# Patient Record
Sex: Male | Born: 1950 | Race: White | Hispanic: No | Smoking: Never smoker
Health system: Southern US, Community
[De-identification: ages and names within clinical notes are randomized; demographics above are authoritative.]

## PROBLEM LIST (undated history)

## (undated) DIAGNOSIS — M549 Dorsalgia, unspecified: Secondary | ICD-10-CM

## (undated) DIAGNOSIS — I10 Essential (primary) hypertension: Secondary | ICD-10-CM

## (undated) DIAGNOSIS — N289 Disorder of kidney and ureter, unspecified: Secondary | ICD-10-CM

## (undated) DIAGNOSIS — C801 Malignant (primary) neoplasm, unspecified: Secondary | ICD-10-CM

## (undated) DIAGNOSIS — M109 Gout, unspecified: Secondary | ICD-10-CM

## (undated) DIAGNOSIS — G8929 Other chronic pain: Secondary | ICD-10-CM

## (undated) DIAGNOSIS — M542 Cervicalgia: Secondary | ICD-10-CM

## (undated) HISTORY — PX: OTHER SURGICAL HISTORY: SHX169

---

## 2000-06-10 ENCOUNTER — Encounter: Payer: Self-pay | Admitting: Emergency Medicine

## 2000-06-10 ENCOUNTER — Emergency Department (HOSPITAL_COMMUNITY): Admission: EM | Admit: 2000-06-10 | Discharge: 2000-06-10 | Payer: Self-pay | Admitting: Emergency Medicine

## 2000-08-08 ENCOUNTER — Inpatient Hospital Stay (HOSPITAL_COMMUNITY): Admission: EM | Admit: 2000-08-08 | Discharge: 2000-08-09 | Payer: Self-pay | Admitting: Emergency Medicine

## 2000-08-08 ENCOUNTER — Encounter: Payer: Self-pay | Admitting: Emergency Medicine

## 2003-08-30 ENCOUNTER — Emergency Department (HOSPITAL_COMMUNITY): Admission: EM | Admit: 2003-08-30 | Discharge: 2003-08-30 | Payer: Self-pay | Admitting: Emergency Medicine

## 2010-06-23 ENCOUNTER — Emergency Department (HOSPITAL_COMMUNITY): Admission: EM | Admit: 2010-06-23 | Discharge: 2010-06-23 | Payer: Self-pay | Admitting: Emergency Medicine

## 2014-04-09 ENCOUNTER — Encounter (HOSPITAL_COMMUNITY): Payer: Self-pay | Admitting: Emergency Medicine

## 2014-04-09 ENCOUNTER — Emergency Department (HOSPITAL_COMMUNITY)
Admission: EM | Admit: 2014-04-09 | Discharge: 2014-04-09 | Disposition: A | Discharge status: Home / Self Care | Payer: No Typology Code available for payment source | Attending: Emergency Medicine | Admitting: Emergency Medicine

## 2014-04-09 ENCOUNTER — Emergency Department (HOSPITAL_COMMUNITY): Payer: No Typology Code available for payment source

## 2014-04-09 DIAGNOSIS — S99929A Unspecified injury of unspecified foot, initial encounter: Secondary | ICD-10-CM

## 2014-04-09 DIAGNOSIS — S199XXA Unspecified injury of neck, initial encounter: Secondary | ICD-10-CM

## 2014-04-09 DIAGNOSIS — I10 Essential (primary) hypertension: Secondary | ICD-10-CM | POA: Insufficient documentation

## 2014-04-09 DIAGNOSIS — S161XXA Strain of muscle, fascia and tendon at neck level, initial encounter: Secondary | ICD-10-CM

## 2014-04-09 DIAGNOSIS — Y9389 Activity, other specified: Secondary | ICD-10-CM | POA: Insufficient documentation

## 2014-04-09 DIAGNOSIS — S99919A Unspecified injury of unspecified ankle, initial encounter: Secondary | ICD-10-CM

## 2014-04-09 DIAGNOSIS — S139XXA Sprain of joints and ligaments of unspecified parts of neck, initial encounter: Secondary | ICD-10-CM | POA: Insufficient documentation

## 2014-04-09 DIAGNOSIS — S0993XA Unspecified injury of face, initial encounter: Secondary | ICD-10-CM | POA: Insufficient documentation

## 2014-04-09 DIAGNOSIS — S8990XA Unspecified injury of unspecified lower leg, initial encounter: Secondary | ICD-10-CM | POA: Insufficient documentation

## 2014-04-09 DIAGNOSIS — Y92410 Unspecified street and highway as the place of occurrence of the external cause: Secondary | ICD-10-CM | POA: Insufficient documentation

## 2014-04-09 DIAGNOSIS — M5030 Other cervical disc degeneration, unspecified cervical region: Secondary | ICD-10-CM | POA: Insufficient documentation

## 2014-04-09 HISTORY — DX: Essential (primary) hypertension: I10

## 2014-04-09 MED ORDER — CYCLOBENZAPRINE HCL 5 MG PO TABS: 5.0000 mg | ORAL_TABLET | Freq: Three times a day (TID) | ORAL | Status: DC | PRN

## 2014-04-09 MED ORDER — IBUPROFEN 200 MG PO TABS
600.0000 mg | ORAL_TABLET | Freq: Once | ORAL | Status: AC
  Administered 2014-04-09: 600 mg via ORAL
  Filled 2014-04-09: qty 3

## 2014-04-09 MED ORDER — IBUPROFEN 600 MG PO TABS: 600.0000 mg | ORAL_TABLET | Freq: Three times a day (TID) | ORAL | Status: AC | PRN
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
## 2014-04-09 NOTE — Discharge Instructions (Signed)
Your xray do not show any fractures
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
## 2014-04-09 NOTE — ED Notes (Signed)
Patient transported to X-ray 

## 2014-04-11 NOTE — ED Provider Notes (Signed)
Medical screening examination/treatment/procedure(s) were performed by non-physician practitioner and as supervising physician I was immediately available for consultation/collaboration.   EKG Interpretation None        Tanna Furry, MD 04/11/14 [phone]

## 2016-06-13 ENCOUNTER — Emergency Department (HOSPITAL_COMMUNITY): Payer: Self-pay

## 2016-06-13 ENCOUNTER — Emergency Department (HOSPITAL_COMMUNITY)
Admission: EM | Admit: 2016-06-13 | Discharge: 2016-06-13 | Disposition: A | Discharge status: Home / Self Care | Payer: Self-pay | Attending: Emergency Medicine | Admitting: Emergency Medicine

## 2016-06-13 ENCOUNTER — Encounter (HOSPITAL_COMMUNITY): Payer: Self-pay | Admitting: Emergency Medicine

## 2016-06-13 DIAGNOSIS — Z79899 Other long term (current) drug therapy: Secondary | ICD-10-CM | POA: Insufficient documentation

## 2016-06-13 DIAGNOSIS — N200 Calculus of kidney: Secondary | ICD-10-CM | POA: Insufficient documentation

## 2016-06-13 DIAGNOSIS — I10 Essential (primary) hypertension: Secondary | ICD-10-CM | POA: Insufficient documentation

## 2016-06-13 LAB — COMPREHENSIVE METABOLIC PANEL
ALBUMIN: 4 g/dL (ref 3.5–5.0)
ALK PHOS: 48 U/L (ref 38–126)
ALT: 10 U/L — ABNORMAL LOW (ref 17–63)
ANION GAP: 6 (ref 5–15)
AST: 15 U/L (ref 15–41)
BILIRUBIN TOTAL: 0.8 mg/dL (ref 0.3–1.2)
BUN: 14 mg/dL (ref 6–20)
CALCIUM: 8.9 mg/dL (ref 8.9–10.3)
CO2: 27 mmol/L (ref 22–32)
Chloride: 105 mmol/L (ref 101–111)
Creatinine, Ser: 1.36 mg/dL — ABNORMAL HIGH (ref 0.61–1.24)
GFR calc non Af Amer: 53 mL/min — ABNORMAL LOW (ref >60)
GLUCOSE: 195 mg/dL — AB (ref 65–99)
POTASSIUM: 3.6 mmol/L (ref 3.5–5.1)
SODIUM: 138 mmol/L (ref 135–145)
TOTAL PROTEIN: 7.2 g/dL (ref 6.5–8.1)

## 2016-06-13 LAB — CBC WITH DIFFERENTIAL/PLATELET
BASOS PCT: 0 %
Basophils Absolute: 0 10*3/uL (ref 0.0–0.1)
EOS ABS: 0 10*3/uL (ref 0.0–0.7)
Eosinophils Relative: 0 %
HEMATOCRIT: 39 % (ref 39.0–52.0)
Hemoglobin: 12.9 g/dL — ABNORMAL LOW (ref 13.0–17.0)
LYMPHS ABS: 1.2 10*3/uL (ref 0.7–4.0)
Lymphocytes Relative: 13 %
MCH: 28.9 pg (ref 26.0–34.0)
MCHC: 33.1 g/dL (ref 30.0–36.0)
MCV: 87.2 fL (ref 78.0–100.0)
MONO ABS: 0.3 10*3/uL (ref 0.1–1.0)
MONOS PCT: 4 %
Neutro Abs: 7.8 10*3/uL — ABNORMAL HIGH (ref 1.7–7.7)
Neutrophils Relative %: 83 %
Platelets: 174 10*3/uL (ref 150–400)
RBC: 4.47 MIL/uL (ref 4.22–5.81)
RDW: 12.9 % (ref 11.5–15.5)
WBC: 9.3 10*3/uL (ref 4.0–10.5)

## 2016-06-13 LAB — URINALYSIS, ROUTINE W REFLEX MICROSCOPIC
Bilirubin Urine: NEGATIVE
GLUCOSE, UA: NEGATIVE mg/dL
KETONES UR: NEGATIVE mg/dL
LEUKOCYTES UA: NEGATIVE
NITRITE: NEGATIVE
PROTEIN: NEGATIVE mg/dL
Specific Gravity, Urine: 1.013 (ref 1.005–1.030)
pH: 5.5 (ref 5.0–8.0)

## 2016-06-13 LAB — URINE MICROSCOPIC-ADD ON: WBC UA: NONE SEEN WBC/hpf (ref 0–5)

## 2016-06-13 MED ORDER — HYDROMORPHONE HCL 1 MG/ML IJ SOLN
1.0000 mg | Freq: Once | INTRAMUSCULAR | Status: AC
  Administered 2016-06-13: 1 mg via INTRAVENOUS
  Filled 2016-06-13: qty 1

## 2016-06-13 MED ORDER — KETOROLAC TROMETHAMINE 30 MG/ML IJ SOLN
30.0000 mg | Freq: Once | INTRAMUSCULAR | Status: AC
  Administered 2016-06-13: 30 mg via INTRAVENOUS
  Filled 2016-06-13: qty 1

## 2016-06-13 MED ORDER — ONDANSETRON HCL 4 MG/2ML IJ SOLN
4.0000 mg | Freq: Once | INTRAMUSCULAR | Status: AC
  Administered 2016-06-13: 4 mg via INTRAVENOUS
  Filled 2016-06-13: qty 2

## 2016-06-13 MED ORDER — ONDANSETRON 4 MG PO TBDP: ORAL_TABLET | ORAL | 0 refills | Status: DC

## 2016-06-13 MED ORDER — HYDROMORPHONE HCL 4 MG PO TABS: 4.0000 mg | ORAL_TABLET | Freq: Four times a day (QID) | ORAL | 0 refills | Status: DC | PRN

## 2016-06-13 NOTE — Discharge Instructions (Signed)
Follow-up with your urologist in a week return if problems

## 2016-06-13 NOTE — ED Notes (Signed)
Pt states he is going to wait for his wife out front. Refuses to stay in the room. Educated not to drive after receiving IV pain medication. 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

## 2016-06-13 NOTE — ED Triage Notes (Signed)
Pt presents to ED with c/o right flank pain with nausea, no vomiting or diarrhea.  Pt reports waking up to severe right flank this morning at 0100.  Has Hx of kidney stones.  Pt took Oxycodone 20 mg 3 hours ago for chronic back/neck pain.

## 2016-06-13 NOTE — ED Provider Notes (Signed)
[facility] DEPT Provider Note   CSN: [id number] Arrival date & time: 06/13/16  [id number]     History   Chief Complaint Chief Complaint  Patient presents with  . Flank Pain    HPI Evan Jimenez is a 65 y.o. male.  Patient complains of right flank pain started this morning very severe. Patient has a history of kidney stones   The history is provided by the patient. No language interpreter was used.  Flank Pain  This is a new problem. The problem occurs constantly. The problem has not changed since onset.Pertinent negatives include no chest pain, no abdominal pain and no headaches. Nothing aggravates the symptoms. Nothing relieves the symptoms. Treatments tried: Patient took some oxycodone without help. The treatment provided mild relief.    Past Medical History:  Diagnosis Date  . Hypertension     There are no active problems to display for this patient.   History reviewed. No pertinent surgical history.     Home Medications    Prior to Admission medications   Medication Sig Start Date End Date Taking? Authorizing Provider  cyclobenzaprine (FLEXERIL) 5 MG tablet Take 1 tablet (5 mg total) by mouth 3 (three) times daily as needed for muscle spasms. 04/09/14  Yes Junius Creamer, NP  enalapril (VASOTEC) 5 MG tablet Take 15 mg by mouth daily. 05/04/16  Yes Historical Provider, MD  Oxycodone HCl 10 MG TABS TAKE 1 TABLET BY MOUTH 5 TIMES daily AS NEEDED FOR PAIN 05/14/16  Yes Historical Provider, MD  traZODone (DESYREL) 50 MG tablet TAKE 1 OR 2 TABLETS BY MOUTH AT BEDTIME AS NEEDED 06/04/16  Yes Historical Provider, MD  cephALEXin (KEFLEX) 500 MG capsule Take 500 mg by mouth 3 (three) times daily. 06/12/16   Historical Provider, MD  HYDROmorphone (DILAUDID) 4 MG tablet Take 1 tablet (4 mg total) by mouth every 6 (six) hours as needed for severe pain. 06/13/16   Milton Ferguson, MD  ibuprofen (ADVIL,MOTRIN) 600 MG tablet Take 1 tablet (600 mg total) by mouth every 8 (eight) hours as  needed. Patient not taking: Reported on 06/13/2016 04/09/14   Junius Creamer, NP  ondansetron (ZOFRAN ODT) 4 MG disintegrating tablet 4mg  ODT q4 hours prn nausea/vomit 06/13/16   Milton Ferguson, MD    Family History History reviewed. No pertinent family history.  Social History Social History  Substance Use Topics  . Smoking status: Never Smoker  . Smokeless tobacco: Never Used  . Alcohol use No     Allergies   Review of patient's allergies indicates no known allergies.   Review of Systems Review of Systems  Constitutional: Negative for appetite change and fatigue.  HENT: Negative for congestion, ear discharge and sinus pressure.   Eyes: Negative for discharge.  Respiratory: Negative for cough.   Cardiovascular: Negative for chest pain.  Gastrointestinal: Negative for abdominal pain and diarrhea.  Genitourinary: Positive for flank pain. Negative for frequency and hematuria.  Musculoskeletal: Negative for back pain.  Skin: Negative for rash.  Neurological: Negative for seizures and headaches.  Psychiatric/Behavioral: Negative for hallucinations.     Physical Exam Updated Vital Signs BP (!) 165/103   Pulse 91   Temp 98.7 F (37.1 C) (Oral)   Resp 20   Ht 6\' 3"  (1.905 m)   Wt 205 lb (93 kg)   SpO2 99%   BMI 25.62 kg/m   Physical Exam  Constitutional: He is oriented to person, place, and time. He appears well-developed.  HENT:  Head: Normocephalic.  Eyes: Conjunctivae  and EOM are normal. No scleral icterus.  Neck: Neck supple. No thyromegaly present.  Cardiovascular: Normal rate and regular rhythm.  Exam reveals no gallop and no friction rub.   No murmur heard. Pulmonary/Chest: No stridor. He has no wheezes. He has no rales. He exhibits no tenderness.  Abdominal: He exhibits no distension. There is no tenderness. There is no rebound.  Genitourinary:  Genitourinary Comments: Patient has tenderness to right flank  Musculoskeletal: Normal range of motion. He exhibits no  edema.  Lymphadenopathy:    He has no cervical adenopathy.  Neurological: He is oriented to person, place, and time. He exhibits normal muscle tone. Coordination normal.  Skin: No rash noted. No erythema.  Psychiatric: He has a normal mood and affect. His behavior is normal.     ED Treatments / Results  Labs (all labs ordered are listed, but only abnormal results are displayed) Labs Reviewed  CBC WITH DIFFERENTIAL/PLATELET - Abnormal; Notable for the following:       Result Value   Hemoglobin 12.9 (*)    Neutro Abs 7.8 (*)    All other components within normal limits  COMPREHENSIVE METABOLIC PANEL - Abnormal; Notable for the following:    Glucose, Bld 195 (*)    Creatinine, Ser 1.36 (*)    ALT 10 (*)    GFR calc non Af Amer 53 (*)    All other components within normal limits  URINALYSIS, ROUTINE W REFLEX MICROSCOPIC (NOT AT [facility]) - Abnormal; Notable for the following:    APPearance CLOUDY (*)    Hgb urine dipstick LARGE (*)    All other components within normal limits  URINE MICROSCOPIC-ADD ON - Abnormal; Notable for the following:    Squamous Epithelial / LPF 0-5 (*)    Bacteria, UA FEW (*)    All other components within normal limits    EKG  EKG Interpretation None       Radiology Ct Renal Stone Study  Result Date: 06/13/2016 CLINICAL DATA:  Right flank pain with nausea EXAM: CT ABDOMEN AND PELVIS WITHOUT CONTRAST TECHNIQUE: Multidetector CT imaging of the abdomen and pelvis was performed following the standard protocol without oral or intravenous contrast material administration. COMPARISON:  August 30, 2003 FINDINGS: Lower chest: Lung bases are clear. There are foci of coronary artery calcification. Hepatobiliary: No focal liver lesions are appreciable on this noncontrast enhanced study. Gallbladder wall is not appreciably thickened. There is no biliary duct dilatation. Pancreas: No pancreatic mass or inflammatory focus. Spleen: No splenic lesions evident. Spleen  measures 14.8 x 13.9 x 8.4 cm with a measured splenic volume of 864 cubic cm. Adrenals/Urinary Tract: Adrenals appear unremarkable bilaterally. There is a 7 mm cyst in the mid right kidney. There is a 1 x 1 cm cyst in the posterior mid left kidney. There is mild to moderate hydronephrosis on the right. There is no hydronephrosis on the left. On the right, there is a 3 mm calculus with a nearby 1 mm calculus in the upper pole region. There is a 3 mm calculus with a nearby 1 mm calculus in the mid right kidney. There is a 2 mm calculus anteriorly in the mid kidney on the right as well. On the left, there is a 3 mm calculus with several 1 mm calculi in the upper to mid regions. There is a 2 mm calculus more posteriorly in the left mid kidney region. There is a 1 mm calcification in the lower pole region on the left. There is  a calculus in the upper left ureter slightly beyond the ureteropelvic junction measuring 4 x 4 mm, appreciable at the inferior L3 level. No other ureteral calculi are evident on either side. Urinary bladder is midline with wall thickness within normal limits. Stomach/Bowel: There are scattered sigmoid diverticula without diverticulitis. There is no bowel wall or mesenteric thickening. There is no evident bowel obstruction. No free air or portal venous air. Vascular/Lymphatic: There is atherosclerotic calcification in the aorta and common iliac arteries. Calcifications is also noted in multiple mesenteric vessels, most notably in the splenic artery. There is moderate calcification at the origin of the left renal artery causing approximately 50% diameter stenosis of the origin of this vessel. There is no abdominal aortic aneurysm. There is no adenopathy in the abdomen or pelvis. Reproductive: Prostate and seminal vesicles appear unremarkable. No pelvic mass or pelvic fluid collection. Other: Appendix is not appreciable. There is no periappendiceal region inflammation. There is no evidence of  adenopathy or abscess in the abdomen or pelvis. There is fat in each inguinal ring. There is again noted a midline ventral hernia. There is a stable focus of fluid within the ventral hernia but no bowel. A linear structure extends from this fluid to the bladder, suggesting that the fluid within the ventral hernia represents a urachal cyst/urachal remnant. Musculoskeletal: There are no blastic or lytic bone lesions. There is no intramuscular lesion. IMPRESSION: 4 x 4 mm calculus in the right ureter at the L3-4 level causing mild to moderate hydronephrosis and proximal ureterectasis on the right. There are intrarenal calculi bilaterally, more on the right than on the left. Splenomegaly of uncertain etiology. No focal splenic lesions evident on this noncontrast enhanced study. Scattered sigmoid diverticula without diverticulitis. No bowel obstruction. No abscess. 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 This finding is stable compared to 2004. No bowel extends into this ventral hernia. There is fat in each inguinal ring. There is aortoiliac atherosclerosis. Calcification is noted in several mesenteric vessels. No high-grade obstruction evident on noncontrast enhanced study. Foci of coronary artery calcification noted. Electronically Signed   By: Lowella Grip III M.D.   On: 06/13/2016 07:56    Procedures Procedures (including critical care time)  Medications Ordered in ED Medications  ketorolac (TORADOL) 30 MG/ML injection 30 mg (30 mg Intravenous Given 06/13/16 0748)  ondansetron (ZOFRAN) injection 4 mg (4 mg Intravenous Given 06/13/16 0748)  HYDROmorphone (DILAUDID) injection 1 mg (1 mg Intravenous Given 06/13/16 0748)     Initial Impression / Assessment and Plan / ED Course  I have reviewed the triage vital signs and the nursing notes.  Pertinent labs & imaging results that were available during my care of the patient were reviewed by me and  considered in my medical decision making (see chart for details).  Clinical Course    Patient has a 4 mm kidney stone on the right. He is to take his Flomax and is given a lot of her pain because his oxycodone was not helping. He will follow-up with his urologist this week  Final Clinical Impressions(s) / ED Diagnoses   Final diagnoses:  Kidney stone    New Prescriptions New Prescriptions   HYDROMORPHONE (DILAUDID) 4 MG TABLET    Take 1 tablet (4 mg total) by mouth every 6 (six) hours as needed for severe pain.   ONDANSETRON (ZOFRAN ODT) 4 MG DISINTEGRATING TABLET    4mg  ODT q4 hours prn nausea/vomit     Milton Ferguson,  MD 06/13/16 [phone]

## 2016-06-27 ENCOUNTER — Emergency Department (HOSPITAL_COMMUNITY)
Admission: EM | Admit: 2016-06-27 | Discharge: 2016-06-27 | Disposition: A | Discharge status: Home / Self Care | Payer: Self-pay | Attending: Emergency Medicine | Admitting: Emergency Medicine

## 2016-06-27 ENCOUNTER — Encounter (HOSPITAL_COMMUNITY): Payer: Self-pay | Admitting: Emergency Medicine

## 2016-06-27 DIAGNOSIS — N23 Unspecified renal colic: Secondary | ICD-10-CM | POA: Insufficient documentation

## 2016-06-27 DIAGNOSIS — I10 Essential (primary) hypertension: Secondary | ICD-10-CM | POA: Insufficient documentation

## 2016-06-27 DIAGNOSIS — N200 Calculus of kidney: Secondary | ICD-10-CM | POA: Insufficient documentation

## 2016-06-27 DIAGNOSIS — Z79899 Other long term (current) drug therapy: Secondary | ICD-10-CM | POA: Insufficient documentation

## 2016-06-27 LAB — URINALYSIS, ROUTINE W REFLEX MICROSCOPIC
BILIRUBIN URINE: NEGATIVE
GLUCOSE, UA: NEGATIVE mg/dL
Ketones, ur: NEGATIVE mg/dL
Nitrite: NEGATIVE
Protein, ur: 30 mg/dL — AB
SPECIFIC GRAVITY, URINE: 1.019 (ref 1.005–1.030)
pH: 5.5 (ref 5.0–8.0)

## 2016-06-27 LAB — URINE MICROSCOPIC-ADD ON

## 2016-06-27 MED ORDER — HYDROMORPHONE HCL 1 MG/ML IJ SOLN
1.0000 mg | Freq: Once | INTRAMUSCULAR | Status: AC
  Administered 2016-06-27: 1 mg via INTRAMUSCULAR
  Filled 2016-06-27: qty 1

## 2016-06-27 MED ORDER — OXYCODONE-ACETAMINOPHEN 5-325 MG PO TABS: 2.0000 | ORAL_TABLET | ORAL | 0 refills | Status: DC | PRN

## 2016-06-27 NOTE — ED Provider Notes (Signed)
[facility] DEPT Provider Note   CSN: [id number] Arrival date & time: 06/27/16  1337     History   Chief Complaint Chief Complaint  Patient presents with  . Flank Pain    HPI Evan Jimenez is a 65 y.o. male.  65 year old male presents with acute onset of left-sided flank pain which began today. History of kidney stone and was seen earlier this month for similar symptoms. He had a 4 mm stone at that time a left side. Pain feels similar. Denies any fever, vomiting. Denies any hematuria. Pain has been colicky and he has used hydrocodone with temporary relief. Denies any scrotal swelling or edema.      Past Medical History:  Diagnosis Date  . Hypertension     There are no active problems to display for this patient.   History reviewed. No pertinent surgical history.     Home Medications    Prior to Admission medications   Medication Sig Start Date End Date Taking? Authorizing Provider  cephALEXin (KEFLEX) 500 MG capsule Take 500 mg by mouth 3 (three) times daily. 06/12/16   Historical Provider, MD  cyclobenzaprine (FLEXERIL) 5 MG tablet Take 1 tablet (5 mg total) by mouth 3 (three) times daily as needed for muscle spasms. 04/09/14   Junius Creamer, NP  enalapril (VASOTEC) 5 MG tablet Take 15 mg by mouth daily. 05/04/16   Historical Provider, MD  HYDROmorphone (DILAUDID) 4 MG tablet Take 1 tablet (4 mg total) by mouth every 6 (six) hours as needed for severe pain. 06/13/16   Milton Ferguson, MD  ibuprofen (ADVIL,MOTRIN) 600 MG tablet Take 1 tablet (600 mg total) by mouth every 8 (eight) hours as needed. Patient not taking: Reported on 06/13/2016 04/09/14   Junius Creamer, NP  ondansetron (ZOFRAN ODT) 4 MG disintegrating tablet 4mg  ODT q4 hours prn nausea/vomit 06/13/16   Milton Ferguson, MD  Oxycodone HCl 10 MG TABS TAKE 1 TABLET BY MOUTH 5 TIMES daily AS NEEDED FOR PAIN 05/14/16   Historical Provider, MD  traZODone (DESYREL) 50 MG tablet TAKE 1 OR 2 TABLETS BY MOUTH AT BEDTIME AS NEEDED  06/04/16   Historical Provider, MD    Family History History reviewed. No pertinent family history.  Social History Social History  Substance Use Topics  . Smoking status: Never Smoker  . Smokeless tobacco: Never Used  . Alcohol use No     Allergies   Review of patient's allergies indicates no known allergies.   Review of Systems Review of Systems  All other systems reviewed and are negative.    Physical Exam Updated Vital Signs BP 155/97 (BP Location: Left Arm)   Pulse 99   Temp 98 F (36.7 C) (Oral)   Resp 18   Ht 6\' 3"  (1.905 m)   Wt 81.6 kg   SpO2 100%   BMI 22.49 kg/m   Physical Exam  Constitutional: He is oriented to person, place, and time. He appears well-developed and well-nourished.  Non-toxic appearance. No distress.  HENT:  Head: Normocephalic and atraumatic.  Eyes: Conjunctivae, EOM and lids are normal. Pupils are equal, round, and reactive to light.  Neck: Normal range of motion. Neck supple. No tracheal deviation present. No thyroid mass present.  Cardiovascular: Normal rate, regular rhythm and normal heart sounds.  Exam reveals no gallop.   No murmur heard. Pulmonary/Chest: Effort normal and breath sounds normal. No stridor. No respiratory distress. He has no decreased breath sounds. He has no wheezes. He has no rhonchi. He has  no rales.  Abdominal: Soft. Normal appearance and bowel sounds are normal. He exhibits no distension. There is no tenderness. There is no rebound and no CVA tenderness.  Musculoskeletal: Normal range of motion. He exhibits no edema or tenderness.  Neurological: He is alert and oriented to person, place, and time. He has normal strength. No cranial nerve deficit or sensory deficit. GCS eye subscore is 4. GCS verbal subscore is 5. GCS motor subscore is 6.  Skin: Skin is warm and dry. No abrasion and no rash noted.  Psychiatric: He has a normal mood and affect. His speech is normal and behavior is normal.  Nursing note and vitals  reviewed.    ED Treatments / Results  Labs (all labs ordered are listed, but only abnormal results are displayed) Labs Reviewed  URINALYSIS, ROUTINE W REFLEX MICROSCOPIC (NOT AT [facility])    EKG  EKG Interpretation None       Radiology No results found.  Procedures Procedures (including critical care time)  Medications Ordered in ED Medications  HYDROmorphone (DILAUDID) injection 1 mg (not administered)     Initial Impression / Assessment and Plan / ED Course  I have reviewed the triage vital signs and the nursing notes.  Pertinent labs & imaging results that were available during my care of the patient were reviewed by me and considered in my medical decision making (see chart for details).  Clinical Course    Patient medicated with IM hydromorphone 2. Urinalysis results noted. Patient will be given referral to urology as well as prescription for pain medication with strict return precautions  Final Clinical Impressions(s) / ED Diagnoses   Final diagnoses:  None    New Prescriptions New Prescriptions   No medications on file     Lacretia Leigh, MD 06/27/16 1725

## 2016-06-27 NOTE — ED Triage Notes (Signed)
Pt states that he has a hx of kidney stones and passed one on his R side approx. 2 weeks ago but feels like another one is moving on his L side now. Started hurting at approx. 10am this morning. Took oxycodone today at home. Alert and oriented.

## 2016-09-25 DIAGNOSIS — M50322 Other cervical disc degeneration at C5-C6 level: Secondary | ICD-10-CM | POA: Diagnosis not present

## 2016-09-25 DIAGNOSIS — Z79891 Long term (current) use of opiate analgesic: Secondary | ICD-10-CM | POA: Diagnosis not present

## 2016-09-25 DIAGNOSIS — M5136 Other intervertebral disc degeneration, lumbar region: Secondary | ICD-10-CM | POA: Diagnosis not present

## 2016-09-25 DIAGNOSIS — M542 Cervicalgia: Secondary | ICD-10-CM | POA: Diagnosis not present

## 2016-11-24 DIAGNOSIS — I10 Essential (primary) hypertension: Secondary | ICD-10-CM | POA: Diagnosis not present

## 2016-11-24 DIAGNOSIS — E119 Type 2 diabetes mellitus without complications: Secondary | ICD-10-CM | POA: Diagnosis not present

## 2016-11-24 DIAGNOSIS — G4700 Insomnia, unspecified: Secondary | ICD-10-CM | POA: Diagnosis not present

## 2016-11-24 DIAGNOSIS — N183 Chronic kidney disease, stage 3 (moderate): Secondary | ICD-10-CM | POA: Diagnosis not present

## 2017-01-24 DIAGNOSIS — M542 Cervicalgia: Secondary | ICD-10-CM | POA: Diagnosis not present

## 2017-01-24 DIAGNOSIS — M50322 Other cervical disc degeneration at C5-C6 level: Secondary | ICD-10-CM | POA: Diagnosis not present

## 2017-01-24 DIAGNOSIS — M5136 Other intervertebral disc degeneration, lumbar region: Secondary | ICD-10-CM | POA: Diagnosis not present

## 2017-01-24 DIAGNOSIS — G894 Chronic pain syndrome: Secondary | ICD-10-CM | POA: Diagnosis not present

## 2017-05-23 DIAGNOSIS — G894 Chronic pain syndrome: Secondary | ICD-10-CM | POA: Diagnosis not present

## 2017-05-23 DIAGNOSIS — M542 Cervicalgia: Secondary | ICD-10-CM | POA: Diagnosis not present

## 2017-05-23 DIAGNOSIS — M50322 Other cervical disc degeneration at C5-C6 level: Secondary | ICD-10-CM | POA: Diagnosis not present

## 2017-05-23 DIAGNOSIS — M5136 Other intervertebral disc degeneration, lumbar region: Secondary | ICD-10-CM | POA: Diagnosis not present

## 2017-08-02 DIAGNOSIS — D485 Neoplasm of uncertain behavior of skin: Secondary | ICD-10-CM | POA: Diagnosis not present

## 2017-08-02 DIAGNOSIS — C4372 Malignant melanoma of left lower limb, including hip: Secondary | ICD-10-CM | POA: Diagnosis not present

## 2017-08-02 DIAGNOSIS — L570 Actinic keratosis: Secondary | ICD-10-CM | POA: Diagnosis not present

## 2017-08-02 DIAGNOSIS — C4441 Basal cell carcinoma of skin of scalp and neck: Secondary | ICD-10-CM | POA: Diagnosis not present

## 2017-08-25 DIAGNOSIS — D0372 Melanoma in situ of left lower limb, including hip: Secondary | ICD-10-CM | POA: Diagnosis not present

## 2017-08-25 DIAGNOSIS — L905 Scar conditions and fibrosis of skin: Secondary | ICD-10-CM | POA: Diagnosis not present

## 2017-09-12 DIAGNOSIS — Z23 Encounter for immunization: Secondary | ICD-10-CM | POA: Diagnosis not present

## 2017-09-28 DIAGNOSIS — M6530 Trigger finger, unspecified finger: Secondary | ICD-10-CM | POA: Diagnosis not present

## 2017-09-28 DIAGNOSIS — Z79891 Long term (current) use of opiate analgesic: Secondary | ICD-10-CM | POA: Diagnosis not present

## 2017-09-28 DIAGNOSIS — M545 Other chronic pain: Secondary | ICD-10-CM | POA: Insufficient documentation

## 2017-09-28 DIAGNOSIS — G894 Chronic pain syndrome: Secondary | ICD-10-CM | POA: Diagnosis not present

## 2017-09-28 DIAGNOSIS — G8929 Other chronic pain: Secondary | ICD-10-CM | POA: Insufficient documentation

## 2017-09-28 DIAGNOSIS — M542 Cervicalgia: Secondary | ICD-10-CM | POA: Diagnosis not present

## 2017-09-28 DIAGNOSIS — M5136 Other intervertebral disc degeneration, lumbar region: Secondary | ICD-10-CM | POA: Insufficient documentation

## 2017-09-29 DIAGNOSIS — C4441 Basal cell carcinoma of skin of scalp and neck: Secondary | ICD-10-CM | POA: Diagnosis not present

## 2017-10-04 DIAGNOSIS — C4441 Basal cell carcinoma of skin of scalp and neck: Secondary | ICD-10-CM | POA: Diagnosis not present

## 2017-10-04 DIAGNOSIS — C4372 Malignant melanoma of left lower limb, including hip: Secondary | ICD-10-CM | POA: Diagnosis not present

## 2017-10-04 DIAGNOSIS — L570 Actinic keratosis: Secondary | ICD-10-CM | POA: Diagnosis not present

## 2017-10-13 DIAGNOSIS — M65332 Trigger finger, left middle finger: Secondary | ICD-10-CM | POA: Diagnosis not present

## 2017-10-13 DIAGNOSIS — M65331 Trigger finger, right middle finger: Secondary | ICD-10-CM | POA: Diagnosis not present

## 2017-11-15 DIAGNOSIS — Z87442 Personal history of urinary calculi: Secondary | ICD-10-CM | POA: Diagnosis not present

## 2017-11-15 DIAGNOSIS — R1084 Generalized abdominal pain: Secondary | ICD-10-CM | POA: Diagnosis not present

## 2017-12-01 DIAGNOSIS — K429 Umbilical hernia without obstruction or gangrene: Secondary | ICD-10-CM | POA: Diagnosis not present

## 2017-12-01 DIAGNOSIS — R1909 Other intra-abdominal and pelvic swelling, mass and lump: Secondary | ICD-10-CM | POA: Diagnosis not present

## 2017-12-16 DIAGNOSIS — M5412 Radiculopathy, cervical region: Secondary | ICD-10-CM | POA: Diagnosis not present

## 2017-12-21 DIAGNOSIS — M9901 Segmental and somatic dysfunction of cervical region: Secondary | ICD-10-CM | POA: Diagnosis not present

## 2017-12-21 DIAGNOSIS — M9902 Segmental and somatic dysfunction of thoracic region: Secondary | ICD-10-CM | POA: Diagnosis not present

## 2017-12-21 DIAGNOSIS — M4712 Other spondylosis with myelopathy, cervical region: Secondary | ICD-10-CM | POA: Diagnosis not present

## 2017-12-21 DIAGNOSIS — M542 Cervicalgia: Secondary | ICD-10-CM | POA: Diagnosis not present

## 2017-12-26 DIAGNOSIS — M542 Cervicalgia: Secondary | ICD-10-CM | POA: Diagnosis not present

## 2017-12-26 DIAGNOSIS — M4712 Other spondylosis with myelopathy, cervical region: Secondary | ICD-10-CM | POA: Diagnosis not present

## 2017-12-26 DIAGNOSIS — M9901 Segmental and somatic dysfunction of cervical region: Secondary | ICD-10-CM | POA: Diagnosis not present

## 2017-12-26 DIAGNOSIS — M9902 Segmental and somatic dysfunction of thoracic region: Secondary | ICD-10-CM | POA: Diagnosis not present

## 2017-12-27 DIAGNOSIS — M5412 Radiculopathy, cervical region: Secondary | ICD-10-CM | POA: Diagnosis not present

## 2018-04-14 DIAGNOSIS — Z79891 Long term (current) use of opiate analgesic: Secondary | ICD-10-CM | POA: Diagnosis not present

## 2018-04-14 DIAGNOSIS — M5136 Other intervertebral disc degeneration, lumbar region: Secondary | ICD-10-CM | POA: Diagnosis not present

## 2018-04-14 DIAGNOSIS — M542 Cervicalgia: Secondary | ICD-10-CM | POA: Diagnosis not present

## 2018-04-25 ENCOUNTER — Encounter (HOSPITAL_COMMUNITY): Payer: Self-pay

## 2018-04-25 ENCOUNTER — Emergency Department (HOSPITAL_COMMUNITY)
Admission: EM | Admit: 2018-04-25 | Discharge: 2018-04-25 | Disposition: A | Discharge status: Home / Self Care | Payer: Medicare Other | Attending: Emergency Medicine | Admitting: Emergency Medicine

## 2018-04-25 ENCOUNTER — Other Ambulatory Visit: Payer: Self-pay

## 2018-04-25 DIAGNOSIS — R109 Unspecified abdominal pain: Secondary | ICD-10-CM | POA: Diagnosis not present

## 2018-04-25 DIAGNOSIS — N200 Calculus of kidney: Secondary | ICD-10-CM | POA: Diagnosis not present

## 2018-04-25 DIAGNOSIS — Z85828 Personal history of other malignant neoplasm of skin: Secondary | ICD-10-CM | POA: Diagnosis not present

## 2018-04-25 DIAGNOSIS — I10 Essential (primary) hypertension: Secondary | ICD-10-CM | POA: Diagnosis not present

## 2018-04-25 DIAGNOSIS — Z79899 Other long term (current) drug therapy: Secondary | ICD-10-CM | POA: Diagnosis not present

## 2018-04-25 HISTORY — DX: Other chronic pain: G89.29

## 2018-04-25 HISTORY — DX: Malignant (primary) neoplasm, unspecified: C80.1

## 2018-04-25 HISTORY — DX: Cervicalgia: M54.2

## 2018-04-25 HISTORY — DX: Disorder of kidney and ureter, unspecified: N28.9

## 2018-04-25 HISTORY — DX: Dorsalgia, unspecified: M54.9

## 2018-04-25 HISTORY — DX: Gout, unspecified: M10.9

## 2018-04-25 LAB — CBC WITH DIFFERENTIAL/PLATELET
Basophils Absolute: 0 10*3/uL (ref 0.0–0.1)
Basophils Relative: 0 %
EOS ABS: 0.2 10*3/uL (ref 0.0–0.7)
Eosinophils Relative: 4 %
HEMATOCRIT: 39.1 % (ref 39.0–52.0)
HEMOGLOBIN: 12.7 g/dL — AB (ref 13.0–17.0)
LYMPHS ABS: 1.4 10*3/uL (ref 0.7–4.0)
LYMPHS PCT: 25 %
MCH: 29 pg (ref 26.0–34.0)
MCHC: 32.5 g/dL (ref 30.0–36.0)
MCV: 89.3 fL (ref 78.0–100.0)
Monocytes Absolute: 0.3 10*3/uL (ref 0.1–1.0)
Monocytes Relative: 6 %
NEUTROS ABS: 3.5 10*3/uL (ref 1.7–7.7)
NEUTROS PCT: 65 %
Platelets: 163 10*3/uL (ref 150–400)
RBC: 4.38 MIL/uL (ref 4.22–5.81)
RDW: 13.2 % (ref 11.5–15.5)
WBC: 5.4 10*3/uL (ref 4.0–10.5)

## 2018-04-25 LAB — URINALYSIS, ROUTINE W REFLEX MICROSCOPIC
Bilirubin Urine: NEGATIVE
Glucose, UA: NEGATIVE mg/dL
Ketones, ur: NEGATIVE mg/dL
Leukocytes, UA: NEGATIVE
Nitrite: NEGATIVE
PH: 5 (ref 5.0–8.0)
Protein, ur: 30 mg/dL — AB
RBC / HPF: >50 RBC/hpf — ABNORMAL HIGH (ref 0–5)
Specific Gravity, Urine: 1.014 (ref 1.005–1.030)

## 2018-04-25 LAB — I-STAT CHEM 8, ED
BUN: 12 mg/dL (ref 8–23)
CALCIUM ION: 1.2 mmol/L (ref 1.15–1.40)
CHLORIDE: 105 mmol/L (ref 98–111)
CREATININE: 1.5 mg/dL — AB (ref 0.61–1.24)
Glucose, Bld: 170 mg/dL — ABNORMAL HIGH (ref 70–99)
HCT: 37 % — ABNORMAL LOW (ref 39.0–52.0)
Hemoglobin: 12.6 g/dL — ABNORMAL LOW (ref 13.0–17.0)
Potassium: 4.2 mmol/L (ref 3.5–5.1)
SODIUM: 142 mmol/L (ref 135–145)
TCO2: 27 mmol/L (ref 22–32)

## 2018-04-25 MED ORDER — SODIUM CHLORIDE 0.9 % IV SOLN
INTRAVENOUS | Status: DC
  Administered 2018-04-25: 11:00:00 via INTRAVENOUS

## 2018-04-25 MED ORDER — KETOROLAC TROMETHAMINE 15 MG/ML IJ SOLN
15.0000 mg | Freq: Once | INTRAMUSCULAR | Status: AC
  Administered 2018-04-25: 15 mg via INTRAVENOUS
  Filled 2018-04-25: qty 1

## 2018-04-25 MED ORDER — ONDANSETRON HCL 4 MG/2ML IJ SOLN
4.0000 mg | Freq: Once | INTRAMUSCULAR | Status: AC
  Administered 2018-04-25: 4 mg via INTRAVENOUS
  Filled 2018-04-25: qty 2

## 2018-04-25 MED ORDER — HYDROMORPHONE HCL 1 MG/ML IJ SOLN
1.0000 mg | Freq: Once | INTRAMUSCULAR | Status: AC
  Administered 2018-04-25: 1 mg via INTRAVENOUS
  Filled 2018-04-25: qty 1

## 2018-04-25 MED ORDER — OXYCODONE-ACETAMINOPHEN 5-325 MG PO TABS: 1.0000 | ORAL_TABLET | Freq: Four times a day (QID) | ORAL | 0 refills | Status: AC | PRN

## 2018-04-25 NOTE — ED Notes (Signed)
Pt to restroom

## 2018-04-25 NOTE — ED Provider Notes (Signed)
[facility] DEPT Provider Note   CSN: [id number] Arrival date & time: 04/25/18  1006     History   Chief Complaint Chief Complaint  Patient presents with  . Flank Pain  . Urinary Retention    HPI Evan Jimenez is a 67 y.o. male.  Patient is a 67 year old male with a history of hypertension, recurrent kidney stones and gout presenting today with left-sided flank and abdominal pain that started yesterday.  He states it initially started off as an ache but then became severe about 5 AM this morning 9 out of 10 pain in the left flank radiating down into the groin.  He took 2 oxycodone that he had at home that he was given by urology in the past for stones but states it did not touch the pain.  He also has Flomax he can take at home but has not taken any yet.  He has never required lithotripsy or stenting done and has always passed the stones on his own.  He is having some hesitation and burning with urination but denies any feelings of retention.  He denies fever or vomiting.  The history is provided by the patient.    Past Medical History:  Diagnosis Date  . Cancer ([place])   . Chronic back pain   . Chronic neck pain   . Gout   . Hypertension   . Renal disorder     There are no active problems to display for this patient.   Past Surgical History:  Procedure Laterality Date  . skin cancer removal          Home Medications    Prior to Admission medications   Medication Sig Start Date End Date Taking? Authorizing Provider  cephALEXin (KEFLEX) 500 MG capsule Take 500 mg by mouth 3 (three) times daily. ABT Start Date 06/13/16. 10 Day Course 06/12/16   [provider]  cyclobenzaprine (FLEXERIL) 5 MG tablet Take 1 tablet (5 mg total) by mouth 3 (three) times daily as needed for muscle spasms. 04/09/14   Junius Creamer, NP  enalapril (VASOTEC) 5 MG tablet Take 15 mg by mouth daily. 05/04/16   [provider]  HYDROmorphone (DILAUDID) 4  MG tablet Take 1 tablet (4 mg total) by mouth every 6 (six) hours as needed for severe pain. 06/13/16   Milton Ferguson, MD  ibuprofen (ADVIL,MOTRIN) 600 MG tablet Take 1 tablet (600 mg total) by mouth every 8 (eight) hours as needed. Patient not taking: Reported on 06/27/2016 04/09/14   Junius Creamer, NP  ondansetron (ZOFRAN ODT) 4 MG disintegrating tablet 4mg  ODT q4 hours prn nausea/vomit 06/13/16   Milton Ferguson, MD  Oxycodone HCl 10 MG TABS TAKE 1 TABLET BY MOUTH 5 TIMES daily AS NEEDED FOR PAIN 05/14/16   [provider]  oxyCODONE-acetaminophen (PERCOCET/ROXICET) 5-325 MG tablet Take 2 tablets by mouth every 4 (four) hours as needed for severe pain. 06/27/16   Lacretia Leigh, MD  tamsulosin (FLOMAX) 0.4 MG CAPS capsule TAKE 1 CAPSULE BY MOUTH IN THE EVENING 06/21/16   [provider]  traZODone (DESYREL) 50 MG tablet TAKE 2 TABLETS BY MOUTH AT BEDTIME AS NEEDED FOR SLEEP 06/04/16   [provider]    Family History Family History  Problem Relation Age of Onset  . Hypertension Mother   . Dementia Mother     Social History Social History   Tobacco Use  . Smoking status: Never Smoker  . Smokeless tobacco: Never Used  Substance  Use Topics  . Alcohol use: No  . Drug use: No     Allergies   Patient has no known allergies.   Review of Systems Review of Systems  All other systems reviewed and are negative.    Physical Exam Updated Vital Signs BP (!) 141/100 (BP Location: Right Arm)   Pulse 96   Temp 97.8 F (36.6 C) (Oral)   Resp 20   Ht 6\' 2"  (1.88 m)   Wt 97.5 kg   SpO2 100%   BMI 27.60 kg/m   Physical Exam  Constitutional: He is oriented to person, place, and time. He appears well-developed and well-nourished. No distress.  HENT:  Head: Normocephalic and atraumatic.  Mouth/Throat: Oropharynx is clear and moist.  Eyes: Pupils are equal, round, and reactive to light. Conjunctivae and EOM are normal.  Neck: Normal range of motion. Neck  supple.  Cardiovascular: Normal rate, regular rhythm and intact distal pulses.  No murmur heard. Pulmonary/Chest: Effort normal and breath sounds normal. No respiratory distress. He has no wheezes. He has no rales.  Abdominal: Soft. He exhibits no distension. There is no tenderness. There is CVA tenderness. There is no rebound and no guarding.  Musculoskeletal: Normal range of motion. He exhibits no edema or tenderness.  Neurological: He is alert and oriented to person, place, and time.  Skin: Skin is warm and dry. No rash noted. No erythema.  Psychiatric: He has a normal mood and affect. His behavior is normal.  Nursing note and vitals reviewed.    ED Treatments / Results  Labs (all labs ordered are listed, but only abnormal results are displayed) Labs Reviewed  URINALYSIS, ROUTINE W REFLEX MICROSCOPIC - Abnormal; Notable for the following components:      Result Value   Color, Urine AMBER (*)    Hgb urine dipstick LARGE (*)    Protein, ur 30 (*)    RBC / HPF >50 (*)    Bacteria, UA RARE (*)    All other components within normal limits  CBC WITH DIFFERENTIAL/PLATELET - Abnormal; Notable for the following components:   Hemoglobin 12.7 (*)    All other components within normal limits  I-STAT CHEM 8, ED - Abnormal; Notable for the following components:   Creatinine, Ser 1.50 (*)    Glucose, Bld 170 (*)    Hemoglobin 12.6 (*)    HCT 37.0 (*)    All other components within normal limits    EKG None  Radiology No results found.  Procedures Procedures (including critical care time)  Medications Ordered in ED Medications  0.9 %  sodium chloride infusion ( Intravenous New Bag/Given 04/25/18 1057)  HYDROmorphone (DILAUDID) injection 1 mg (1 mg Intravenous Given 04/25/18 1057)  ondansetron (ZOFRAN) injection 4 mg (4 mg Intravenous Given 04/25/18 1058)  ketorolac (TORADOL) 15 MG/ML injection 15 mg (15 mg Intravenous Given 04/25/18 1309)     Initial Impression / Assessment and  Plan / ED Course  I have reviewed the triage vital signs and the nursing notes.  Pertinent labs & imaging results that were available during my care of the patient were reviewed by me and considered in my medical decision making (see chart for details).     Pt with symptoms consistent with kidney stone.  Denies infectious sx, or GI symptoms.  Low concern for diverticulitis and low risk without history suggestive of AAA.  No hx suggestive of GU source (discharge)..  Will hydrate, treat pain and ensure no infection with UA, CBC, BMP.  Last CT pt had in 2017 showed multiple bilateral stones 29mm or less in the left kidney  1:01 PM Normal CBC.  istat chem 8 with mild renal dysfunction with creatinine of 1.5. UA with blood but no signs of infection.  Pain improved after dilaudid but will also give toradol and hopefully pt will be pain controlled to go home and continue home meds.  2:18 PM Pain is controlled and pt d/ced home with pain control and he already has flomax at home. Final Clinical Impressions(s) / ED Diagnoses   Final diagnoses:  Kidney stone    ED Discharge Orders         Ordered    oxyCODONE-acetaminophen (PERCOCET/ROXICET) 5-325 MG tablet  Every 6 hours PRN     04/25/18 1417           Blanchie Dessert, MD 04/25/18 1418

## 2018-04-25 NOTE — Discharge Instructions (Signed)
Use the pain medicine as needed.  Start taking your Flomax that you have at home.  Follow-up with urology if things are not improving.

## 2018-04-25 NOTE — ED Triage Notes (Signed)
Patient c/o left flank pain that radiates into t he left groin area x 2 days. Patient reports a history of kidney stones. Patient c/o urinary retention.

## 2018-05-22 DIAGNOSIS — G4700 Insomnia, unspecified: Secondary | ICD-10-CM | POA: Diagnosis not present

## 2018-05-22 DIAGNOSIS — Z1159 Encounter for screening for other viral diseases: Secondary | ICD-10-CM | POA: Diagnosis not present

## 2018-05-22 DIAGNOSIS — N183 Chronic kidney disease, stage 3 (moderate): Secondary | ICD-10-CM | POA: Diagnosis not present

## 2018-05-22 DIAGNOSIS — E1122 Type 2 diabetes mellitus with diabetic chronic kidney disease: Secondary | ICD-10-CM | POA: Diagnosis not present

## 2018-05-22 DIAGNOSIS — I10 Essential (primary) hypertension: Secondary | ICD-10-CM | POA: Diagnosis not present

## 2018-05-29 DIAGNOSIS — L219 Seborrheic dermatitis, unspecified: Secondary | ICD-10-CM | POA: Diagnosis not present

## 2018-05-29 DIAGNOSIS — L814 Other melanin hyperpigmentation: Secondary | ICD-10-CM | POA: Diagnosis not present

## 2018-05-29 DIAGNOSIS — C44319 Basal cell carcinoma of skin of other parts of face: Secondary | ICD-10-CM | POA: Diagnosis not present

## 2018-05-29 DIAGNOSIS — L821 Other seborrheic keratosis: Secondary | ICD-10-CM | POA: Diagnosis not present

## 2018-05-29 DIAGNOSIS — L570 Actinic keratosis: Secondary | ICD-10-CM | POA: Diagnosis not present

## 2018-05-29 DIAGNOSIS — Z85828 Personal history of other malignant neoplasm of skin: Secondary | ICD-10-CM | POA: Diagnosis not present

## 2018-05-29 DIAGNOSIS — C44519 Basal cell carcinoma of skin of other part of trunk: Secondary | ICD-10-CM | POA: Diagnosis not present

## 2018-05-29 DIAGNOSIS — Z85820 Personal history of malignant melanoma of skin: Secondary | ICD-10-CM | POA: Diagnosis not present

## 2018-05-29 DIAGNOSIS — L818 Other specified disorders of pigmentation: Secondary | ICD-10-CM | POA: Diagnosis not present

## 2018-05-29 DIAGNOSIS — D485 Neoplasm of uncertain behavior of skin: Secondary | ICD-10-CM | POA: Diagnosis not present

## 2018-07-20 DIAGNOSIS — E1122 Type 2 diabetes mellitus with diabetic chronic kidney disease: Secondary | ICD-10-CM | POA: Diagnosis not present

## 2018-07-20 DIAGNOSIS — Z23 Encounter for immunization: Secondary | ICD-10-CM | POA: Diagnosis not present

## 2018-09-05 DIAGNOSIS — G894 Chronic pain syndrome: Secondary | ICD-10-CM | POA: Diagnosis not present

## 2018-09-05 DIAGNOSIS — M542 Cervicalgia: Secondary | ICD-10-CM | POA: Diagnosis not present

## 2018-09-05 DIAGNOSIS — M5136 Other intervertebral disc degeneration, lumbar region: Secondary | ICD-10-CM | POA: Diagnosis not present

## 2018-09-05 DIAGNOSIS — Z79891 Long term (current) use of opiate analgesic: Secondary | ICD-10-CM | POA: Diagnosis not present

## 2018-09-05 DIAGNOSIS — M545 Low back pain: Secondary | ICD-10-CM | POA: Diagnosis not present

## 2018-09-05 DIAGNOSIS — M5030 Other cervical disc degeneration, unspecified cervical region: Secondary | ICD-10-CM | POA: Diagnosis not present

## 2018-09-27 DIAGNOSIS — C44319 Basal cell carcinoma of skin of other parts of face: Secondary | ICD-10-CM | POA: Diagnosis not present

## 2018-10-12 ENCOUNTER — Ambulatory Visit: Payer: Self-pay | Admitting: Surgery

## 2018-10-12 DIAGNOSIS — K4090 Unilateral inguinal hernia, without obstruction or gangrene, not specified as recurrent: Secondary | ICD-10-CM | POA: Diagnosis not present

## 2018-10-12 DIAGNOSIS — K439 Ventral hernia without obstruction or gangrene: Secondary | ICD-10-CM | POA: Diagnosis not present

## 2018-10-30 ENCOUNTER — Other Ambulatory Visit (HOSPITAL_COMMUNITY): Payer: Self-pay | Admitting: *Deleted

## 2019-01-04 DIAGNOSIS — M542 Cervicalgia: Secondary | ICD-10-CM | POA: Diagnosis not present

## 2019-01-04 DIAGNOSIS — G894 Chronic pain syndrome: Secondary | ICD-10-CM | POA: Diagnosis not present

## 2019-01-04 DIAGNOSIS — M545 Low back pain: Secondary | ICD-10-CM | POA: Diagnosis not present

## 2019-02-05 DIAGNOSIS — G4700 Insomnia, unspecified: Secondary | ICD-10-CM | POA: Diagnosis not present

## 2019-02-05 DIAGNOSIS — I10 Essential (primary) hypertension: Secondary | ICD-10-CM | POA: Diagnosis not present

## 2019-02-05 DIAGNOSIS — E1122 Type 2 diabetes mellitus with diabetic chronic kidney disease: Secondary | ICD-10-CM | POA: Diagnosis not present

## 2019-02-05 DIAGNOSIS — N529 Male erectile dysfunction, unspecified: Secondary | ICD-10-CM | POA: Diagnosis not present

## 2019-02-05 DIAGNOSIS — N183 Chronic kidney disease, stage 3 (moderate): Secondary | ICD-10-CM | POA: Diagnosis not present

## 2019-02-19 DIAGNOSIS — N529 Male erectile dysfunction, unspecified: Secondary | ICD-10-CM | POA: Diagnosis not present

## 2019-02-19 DIAGNOSIS — E1122 Type 2 diabetes mellitus with diabetic chronic kidney disease: Secondary | ICD-10-CM | POA: Diagnosis not present

## 2019-02-19 DIAGNOSIS — G4700 Insomnia, unspecified: Secondary | ICD-10-CM | POA: Diagnosis not present

## 2019-02-19 DIAGNOSIS — I10 Essential (primary) hypertension: Secondary | ICD-10-CM | POA: Diagnosis not present

## 2019-02-19 DIAGNOSIS — N183 Chronic kidney disease, stage 3 (moderate): Secondary | ICD-10-CM | POA: Diagnosis not present

## 2019-02-26 DIAGNOSIS — E119 Type 2 diabetes mellitus without complications: Secondary | ICD-10-CM | POA: Diagnosis not present

## 2019-03-19 DIAGNOSIS — R197 Diarrhea, unspecified: Secondary | ICD-10-CM | POA: Diagnosis not present

## 2019-03-19 DIAGNOSIS — R112 Nausea with vomiting, unspecified: Secondary | ICD-10-CM | POA: Diagnosis not present

## 2019-05-09 DIAGNOSIS — Z79891 Long term (current) use of opiate analgesic: Secondary | ICD-10-CM | POA: Diagnosis not present

## 2019-05-09 DIAGNOSIS — G894 Chronic pain syndrome: Secondary | ICD-10-CM | POA: Diagnosis not present

## 2019-05-25 ENCOUNTER — Encounter (HOSPITAL_COMMUNITY): Payer: Self-pay | Admitting: Radiology

## 2019-05-25 ENCOUNTER — Emergency Department (HOSPITAL_COMMUNITY): Payer: Medicare Other

## 2019-05-25 ENCOUNTER — Other Ambulatory Visit: Payer: Self-pay

## 2019-05-25 ENCOUNTER — Emergency Department (HOSPITAL_COMMUNITY)
Admission: EM | Admit: 2019-05-25 | Discharge: 2019-05-25 | Disposition: A | Discharge status: Home / Self Care | Payer: Medicare Other | Attending: Emergency Medicine | Admitting: Emergency Medicine

## 2019-05-25 DIAGNOSIS — Z85828 Personal history of other malignant neoplasm of skin: Secondary | ICD-10-CM | POA: Insufficient documentation

## 2019-05-25 DIAGNOSIS — N200 Calculus of kidney: Secondary | ICD-10-CM | POA: Insufficient documentation

## 2019-05-25 DIAGNOSIS — R109 Unspecified abdominal pain: Secondary | ICD-10-CM | POA: Diagnosis present

## 2019-05-25 DIAGNOSIS — K4090 Unilateral inguinal hernia, without obstruction or gangrene, not specified as recurrent: Secondary | ICD-10-CM | POA: Insufficient documentation

## 2019-05-25 DIAGNOSIS — N132 Hydronephrosis with renal and ureteral calculous obstruction: Secondary | ICD-10-CM | POA: Diagnosis not present

## 2019-05-25 DIAGNOSIS — N1330 Unspecified hydronephrosis: Secondary | ICD-10-CM | POA: Diagnosis not present

## 2019-05-25 DIAGNOSIS — Z79899 Other long term (current) drug therapy: Secondary | ICD-10-CM | POA: Insufficient documentation

## 2019-05-25 LAB — URINALYSIS, ROUTINE W REFLEX MICROSCOPIC
Bacteria, UA: NONE SEEN
Bilirubin Urine: NEGATIVE
Glucose, UA: 150 mg/dL — AB
Ketones, ur: NEGATIVE mg/dL
Leukocytes,Ua: NEGATIVE
Nitrite: NEGATIVE
Protein, ur: NEGATIVE mg/dL
RBC / HPF: >50 RBC/hpf — ABNORMAL HIGH (ref 0–5)
Specific Gravity, Urine: 1.013 (ref 1.005–1.030)
pH: 5 (ref 5.0–8.0)

## 2019-05-25 LAB — CBC WITH DIFFERENTIAL/PLATELET
Abs Immature Granulocytes: 0.1 10*3/uL — ABNORMAL HIGH (ref 0.00–0.07)
Basophils Absolute: 0 10*3/uL (ref 0.0–0.1)
Basophils Relative: 0 %
Eosinophils Absolute: 0 10*3/uL (ref 0.0–0.5)
Eosinophils Relative: 0 %
HCT: 40.8 % (ref 39.0–52.0)
Hemoglobin: 13 g/dL (ref 13.0–17.0)
Immature Granulocytes: 1 %
Lymphocytes Relative: 8 %
Lymphs Abs: 0.8 10*3/uL (ref 0.7–4.0)
MCH: 29 pg (ref 26.0–34.0)
MCHC: 31.9 g/dL (ref 30.0–36.0)
MCV: 91.1 fL (ref 80.0–100.0)
Monocytes Absolute: 0.4 10*3/uL (ref 0.1–1.0)
Monocytes Relative: 4 %
Neutro Abs: 8.8 10*3/uL — ABNORMAL HIGH (ref 1.7–7.7)
Neutrophils Relative %: 87 %
Platelets: 153 10*3/uL (ref 150–400)
RBC: 4.48 MIL/uL (ref 4.22–5.81)
RDW: 12.5 % (ref 11.5–15.5)
WBC: 10 10*3/uL (ref 4.0–10.5)
nRBC: 0 % (ref 0.0–0.2)

## 2019-05-25 LAB — COMPREHENSIVE METABOLIC PANEL
ALT: 12 U/L (ref 0–44)
AST: 15 U/L (ref 15–41)
Albumin: 4.2 g/dL (ref 3.5–5.0)
Alkaline Phosphatase: 64 U/L (ref 38–126)
Anion gap: 10 (ref 5–15)
BUN: 16 mg/dL (ref 8–23)
CO2: 24 mmol/L (ref 22–32)
Calcium: 9 mg/dL (ref 8.9–10.3)
Chloride: 105 mmol/L (ref 98–111)
Creatinine, Ser: 1.6 mg/dL — ABNORMAL HIGH (ref 0.61–1.24)
GFR calc Af Amer: 51 mL/min — ABNORMAL LOW (ref >60)
GFR calc non Af Amer: 44 mL/min — ABNORMAL LOW (ref >60)
Glucose, Bld: 317 mg/dL — ABNORMAL HIGH (ref 70–99)
Potassium: 4.2 mmol/L (ref 3.5–5.1)
Sodium: 139 mmol/L (ref 135–145)
Total Bilirubin: 0.6 mg/dL (ref 0.3–1.2)
Total Protein: 7.7 g/dL (ref 6.5–8.1)

## 2019-05-25 LAB — LIPASE, BLOOD: Lipase: 23 U/L (ref 11–51)

## 2019-05-25 MED ORDER — TAMSULOSIN HCL 0.4 MG PO CAPS: 0.4000 mg | ORAL_CAPSULE | Freq: Every day | ORAL | 0 refills | Status: AC

## 2019-05-25 MED ORDER — MORPHINE SULFATE (PF) 4 MG/ML IV SOLN
4.0000 mg | Freq: Once | INTRAVENOUS | Status: AC
  Administered 2019-05-25: 4 mg via INTRAVENOUS
  Filled 2019-05-25: qty 1

## 2019-05-25 MED ORDER — ONDANSETRON 4 MG PO TBDP
4.0000 mg | ORAL_TABLET | Freq: Once | ORAL | Status: AC
  Administered 2019-05-25: 4 mg via ORAL
  Filled 2019-05-25: qty 1

## 2019-05-25 MED ORDER — ONDANSETRON 4 MG PO TBDP: 4.0000 mg | ORAL_TABLET | Freq: Three times a day (TID) | ORAL | 0 refills | Status: AC | PRN

## 2019-05-25 MED ORDER — OXYCODONE-ACETAMINOPHEN 5-325 MG PO TABS
1.0000 | ORAL_TABLET | ORAL | Status: DC | PRN
  Administered 2019-05-25: 1 via ORAL
  Filled 2019-05-25: qty 1

## 2019-05-25 MED ORDER — FENTANYL CITRATE (PF) 100 MCG/2ML IJ SOLN
50.0000 ug | Freq: Once | INTRAMUSCULAR | Status: AC
  Administered 2019-05-25: 50 ug via INTRAVENOUS
  Filled 2019-05-25: qty 2

## 2019-05-25 NOTE — Discharge Instructions (Addendum)
You have been diagnosed today with kidney stone, inguinal hernia, bilateral hydronephrosis.  At this time there does not appear to be the presence of an emergent medical condition, however there is always the potential for conditions to change. Please read and follow the below instructions.  Please return to the Emergency Department immediately for any new or worsening symptoms. Please be sure to follow up with your Primary Care Provider within one week regarding your visit today; please call their office to schedule an appointment even if you are feeling better for a follow-up visit. Please call your urologist today to schedule a follow-up appointment regarding your kidney stone and bilateral hydronephrosis. We are unable to provide you with any narcotic medications today as you have oxycodone 10 already from your pain doctor.  You may supplement your medications with acetaminophen/Tylenol as directed on the packaging to help with your symptoms as long as you are oxycodone does not already contain acetaminophen/Tylenol. You may use medication Flomax to help facilitate stone passage. You may use medication Zofran as prescribed for nausea and vomiting. Please call your surgeon at [facility] surgery today to schedule a follow-up appointment regarding your inguinal hernia that now appears to contain a portion of your sigmoid colon.  Return to the emergency department immediately if you have pain in her left lower abdomen or groin.  Get help right away if: You have a fever or chills. You get very bad pain. You get new pain in your belly (abdomen). You pass out (faint). You cannot pee. The area where your leg meets your lower belly has: Pain that gets worse suddenly. A bulge that gets bigger suddenly, and it does not get smaller after that. A bulge that turns red or purple. A bulge that is painful when you touch it. You are a man, and your scrotum: Suddenly feels painful. Suddenly changes  in size. You cannot push the hernia in by very gently pressing on it when you are lying down. Do not try to force the bulge back in if it will not push in easily. You feel sick to your stomach (nauseous), and that feeling does not go away. You throw up (vomit), and that keeps happening. You have a fast heartbeat. You cannot poop (have a bowel movement) or pass gas. You have any new/concerning or worsening symptoms  Please read the additional information packets attached to your discharge summary.  Do not take your medicine if  develop an itchy rash, swelling in your mouth or lips, or difficulty breathing; call 911 and seek immediate emergency medical attention if this occurs.

## 2019-05-25 NOTE — ED Provider Notes (Signed)
[facility] DEPT Provider Note   CSN: [id number] Arrival date & time: 05/25/19  [id number]     History   Chief Complaint Chief Complaint  Patient presents with  . Flank Pain    HPI XUE OVERTON is a 68 y.o. male with history of skin cancer, chronic back/neck pain, gout, hypertension, nephrolithiasis presents today for left flank pain.  Patient reports that this morning he began having pain in his left flank and left abdomen a severe sharp/throb constant without radiation or aggravating/alleviating factors.  He reports pain feels very similar to previous kidney stone pain and reports this would be is fifth kidney stone today.  He reports that he is followed by urology and has never needed procedure for stone removal.  He reports one episode of nausea with nonbloody/nonbilious emesis during severe pain prior to arrival.  He reports his pain has gradually improved and now is only moderate in intensity.  Denies fever/chills, headache, chest pain/shortness of breath, diarrhea, hematuria/dysuria, testicular pain/swelling, fall/injury, numbness/tingling, weakness or any additional concerns.     HPI  Past Medical History:  Diagnosis Date  . Cancer ([place])   . Chronic back pain   . Chronic neck pain   . Gout   . Hypertension   . Renal disorder     There are no active problems to display for this patient.   Past Surgical History:  Procedure Laterality Date  . skin cancer removal          Home Medications    Prior to Admission medications   Medication Sig Start Date End Date Taking? Authorizing Provider  atorvastatin (LIPITOR) 10 MG tablet Take 10 mg by mouth daily. 02/22/19   [provider]  cyclobenzaprine (FLEXERIL) 5 MG tablet Take 1 tablet (5 mg total) by mouth 3 (three) times daily as needed for muscle spasms. Patient not taking: Reported on 04/25/2018 04/09/14   Junius Creamer, NP  enalapril (VASOTEC) 5 MG tablet Take 10 mg by mouth daily.   05/04/16   [provider]  glimepiride (AMARYL) 4 MG tablet Take 4 mg by mouth daily. 02/27/19   [provider]  HYDROmorphone (DILAUDID) 4 MG tablet Take 1 tablet (4 mg total) by mouth every 6 (six) hours as needed for severe pain. Patient not taking: Reported on 04/25/2018 06/13/16   Milton Ferguson, MD  ibuprofen (ADVIL,MOTRIN) 200 MG tablet Take 600 mg by mouth daily as needed (severe pain).    [provider]  ibuprofen (ADVIL,MOTRIN) 600 MG tablet Take 1 tablet (600 mg total) by mouth every 8 (eight) hours as needed. Patient not taking: Reported on 04/25/2018 04/09/14   Junius Creamer, NP  ondansetron (ZOFRAN ODT) 4 MG disintegrating tablet Take 1 tablet (4 mg total) by mouth every 8 (eight) hours as needed for up to 5 days for nausea or vomiting. 05/25/19 05/30/19  Nuala Alpha A, PA-C  ondansetron (ZOFRAN) 4 MG tablet Take 4 mg by mouth daily. 05/08/19   [provider]  Oxycodone HCl 10 MG TABS Take 10 mg by mouth every 6 (six) hours as needed for pain. Up to 5 times daily 05/10/19   [provider]  oxyCODONE-acetaminophen (PERCOCET/ROXICET) 5-325 MG tablet Take 1-2 tablets by mouth every 6 (six) hours as needed for severe pain. 04/25/18   Blanchie Dessert, MD  sildenafil (REVATIO) 20 MG tablet Take 20-100 mg by mouth as needed for erectile dysfunction. 05/16/19   [provider]  tamsulosin (FLOMAX) 0.4 MG CAPS capsule  Take 1 capsule (0.4 mg total) by mouth daily after breakfast for 7 days. 05/25/19 06/01/19  Nuala Alpha A, PA-C  traZODone (DESYREL) 100 MG tablet Take 100 mg by mouth at bedtime. 04/06/18   [provider]    Family History Family History  Problem Relation Age of Onset  . Hypertension Mother   . Dementia Mother     Social History Social History   Tobacco Use  . Smoking status: Never Smoker  . Smokeless tobacco: Never Used  Substance Use Topics  . Alcohol use: No  . Drug use: No     Allergies   Patient  has no known allergies.   Review of Systems Review of Systems Ten systems are reviewed and are negative for acute change except as noted in the HPI   Physical Exam Updated Vital Signs BP (!) 154/98   Pulse 90   Temp 98.8 F (37.1 C) (Oral)   Resp 14   Ht 6\' 2"  (1.88 m)   Wt 94.8 kg   SpO2 98%   BMI 26.83 kg/m   Physical Exam Constitutional:      General: He is not in acute distress.    Appearance: Normal appearance. He is well-developed. He is not ill-appearing or diaphoretic.  HENT:     Head: Normocephalic and atraumatic.     Right Ear: External ear normal.     Left Ear: External ear normal.     Nose: Nose normal.  Eyes:     General: Vision grossly intact. Gaze aligned appropriately.     Pupils: Pupils are equal, round, and reactive to light.  Neck:     Musculoskeletal: Normal range of motion.     Trachea: Trachea and phonation normal. No tracheal deviation.  Pulmonary:     Effort: Pulmonary effort is normal. No respiratory distress.  Abdominal:     General: There is no distension.     Palpations: Abdomen is soft.     Tenderness: There is no abdominal tenderness. There is left CVA tenderness. There is no right CVA tenderness, guarding or rebound.     Comments: No sign of injury of the abdomen  Musculoskeletal: Normal range of motion.  Skin:    General: Skin is warm and dry.  Neurological:     Mental Status: He is alert.     GCS: GCS eye subscore is 4. GCS verbal subscore is 5. GCS motor subscore is 6.     Comments: Speech is clear and goal oriented, follows commands Major Cranial nerves without deficit, no facial droop Moves extremities without ataxia, coordination intact  Psychiatric:        Behavior: Behavior normal.    ED Treatments / Results  Labs (all labs ordered are listed, but only abnormal results are displayed) Labs Reviewed  URINALYSIS, ROUTINE W REFLEX MICROSCOPIC - Abnormal; Notable for the following components:      Result Value   Glucose,  UA 150 (*)    Hgb urine dipstick MODERATE (*)    RBC / HPF >50 (*)    All other components within normal limits  CBC WITH DIFFERENTIAL/PLATELET - Abnormal; Notable for the following components:   Neutro Abs 8.8 (*)    Abs Immature Granulocytes 0.10 (*)    All other components within normal limits  COMPREHENSIVE METABOLIC PANEL - Abnormal; Notable for the following components:   Glucose, Bld 317 (*)    Creatinine, Ser 1.60 (*)    GFR calc non Af Amer 44 (*)  GFR calc Af Amer 51 (*)    All other components within normal limits  URINE CULTURE  LIPASE, BLOOD    EKG None  Radiology Ct Renal Stone Study  Result Date: 05/25/2019 CLINICAL DATA:  Left flank pain with nausea and vomiting. History of kidney stones. EXAM: CT ABDOMEN AND PELVIS WITHOUT CONTRAST TECHNIQUE: Multidetector CT imaging of the abdomen and pelvis was performed following the standard protocol without IV contrast. COMPARISON:  Abdominopelvic CT 06/13/2016. FINDINGS: Lower chest: 6 mm subpleural right lower lobe nodule on image 3/6 is stable, consistent with a benign finding. The lung bases are otherwise clear. There is no pleural or pericardial effusion. There is coronary artery atherosclerosis. Hepatobiliary: The liver appears unremarkable as imaged in the noncontrast state. No evidence of gallstones, gallbladder wall thickening or biliary dilatation. Pancreas: Unremarkable. No pancreatic ductal dilatation or surrounding inflammatory changes. Spleen: Normal in size without focal abnormality. Adrenals/Urinary Tract: Both adrenal glands appear normal. There is progressive bilateral nephrolithiasis compared with the previous study. In the upper pole of the right kidney, there is a 6 mm calculus on image 41/2. There are calculi measuring 5 mm in the upper pole of the left kidney on image 43/2 and 4 mm in the lower pole on image 55/2. There is new asymmetric perinephric soft tissue stranding on the left and mild left-sided  hydronephrosis secondary to an obstructing ureter in the distal left calculus. This measures 3 mm on image 84/2 and is located 3.5 cm above the ureterovesical junction. There is no right-sided hydronephrosis. Underlying small bilateral renal cysts are grossly stable. The bladder appears normal. Stomach/Bowel: No evidence of bowel wall thickening, distention or surrounding inflammatory change. The sigmoid colon extends into a left inguinal hernia. No evidence of incarceration or obstruction. There are mild distal colonic diverticular changes. The cecum is located in the right mid abdomen. Vascular/Lymphatic: There are no enlarged abdominal or pelvic lymph nodes. Mild aortic and branch vessel atherosclerosis. Reproductive: The prostate gland and seminal vesicles appear normal. Other: There are bilateral inguinal hernias. As above, there is new extension of the sigmoid colon into the left inguinal hernia. No evidence of bowel obstruction or incarceration. No ascites or free air. An umbilical hernia contains slightly more fluid than on the previous study. Musculoskeletal: No acute or significant osseous findings. IMPRESSION: 1. Partially obstructing 3 mm calculus in the distal left ureter. 2. Bilateral hydronephrosis, increased in size and number compared with previous CT from 2017. 3. Left inguinal hernia now contains a portion of the sigmoid colon. No evidence of incarceration or bowel obstruction. An umbilical hernia contains slightly more fluid compared with the previous study. 4.  Aortic Atherosclerosis (ICD10-I70.0). Electronically Signed   By: Richardean Sale M.D.   On: 05/25/2019 11:55    Procedures Procedures (including critical care time)  Medications Ordered in ED Medications  oxyCODONE-acetaminophen (PERCOCET/ROXICET) 5-325 MG per tablet 1 tablet (1 tablet Oral Given 05/25/19 0932)  ondansetron (ZOFRAN-ODT) disintegrating tablet 4 mg (4 mg Oral Given 05/25/19 0932)  fentaNYL (SUBLIMAZE) injection 50  mcg (50 mcg Intravenous Given 05/25/19 1154)  morphine 4 MG/ML injection 4 mg (4 mg Intravenous Given 05/25/19 1310)     Initial Impression / Assessment and Plan / ED Course  I have reviewed the triage vital signs and the nursing notes.  Pertinent labs & imaging results that were available during my care of the patient were reviewed by me and considered in my medical decision making (see chart for details).  Clinical Course as  of May 24 1426  Fri May 24, 6173  189 68 year old male with history of kidney stones here with left flank pain left lower quadrant pain.  Was the pain acutely worsened today.  His urinalysis shows 50 reds and his CT shows a ureteral stone on the left.  Likely discharge if we can get adequate pain control with outpatient follow-up urology.   [MB]    Clinical Course User Index [MB] Hayden Rasmussen, MD   CBC nonacute CMP with glucose 317, creatinine 1.6, creatinine appears baseline glucose elevated from prior, normal CO2 and anion gap no evidence of metabolic crisis Lipase within normal limits Urinalysis with hemoglobin and greater than 50 red blood cells, 150 glucose, no evidence of infection  CT renal stone study:    IMPRESSION:  1. Partially obstructing 3 mm calculus in the distal left ureter.  2. Bilateral hydronephrosis, increased in size and number compared  with previous CT from 2017.  3. Left inguinal hernia now contains a portion of the sigmoid colon.  No evidence of incarceration or bowel obstruction. An umbilical  hernia contains slightly more fluid compared with the previous  study.  4. Aortic Atherosclerosis (ICD10-I70.0).  ------ Patient reevaluated resting comfortably in bed no acute distress.  States great improvement of his pain.  He has been informed of results today and need for urology follow-up regarding his 3 mm stone as well as bilateral hydronephrosis.  He will call them today to schedule a follow-up appointment.  He has also been  informed of his left inguinal hernia, he reports that he was already aware of this and is following up with [facility] for repair.  He has no tenderness of the left inguinal canal and reports that he has had no pain in this area.  He plans to call [facility] today to schedule a follow-up appointment for repair.  Review of PMP reveals that patient receives oxycodone 10 5 times daily, will give additional narcotics to the patient today to avoid pain contract.  Patient can supplement with Tylenol as long as medication does not contain this already.  Prescribed Zofran as needed for nausea and vomiting. Prescribed Flomax to help facilitate stone passage. No indications for antibiotics at this time.  At this time there does not appear to be any evidence of an acute emergency medical condition and the patient appears stable for discharge with appropriate outpatient follow up. Diagnosis was discussed with patient who verbalizes understanding of care plan and is agreeable to discharge. I have discussed return precautions with patient who verbalizes understanding of return precautions. Patient encouraged to follow-up with their PCP, urology and general surgery. All questions answered.  Patient's case rediscussed with Dr. Melina Copa who agrees with plan to discharge with Flomax/Zofran and outpatient follow-up.   Note: Portions of this report may have been transcribed using voice recognition software. Every effort was made to ensure accuracy; however, inadvertent computerized transcription errors may still be present. Final Clinical Impressions(s) / ED Diagnoses   Final diagnoses:  Kidney stone  Unilateral inguinal hernia without obstruction or gangrene, recurrence not specified  Bilateral hydronephrosis    ED Discharge Orders         Ordered    tamsulosin (FLOMAX) 0.4 MG CAPS capsule  Daily after breakfast     05/25/19 1425    ondansetron (ZOFRAN ODT) 4 MG disintegrating tablet   Every 8 hours PRN     05/25/19 1425  Gari Crown 05/25/19 1428    Hayden Rasmussen, MD 05/25/19 [phone]

## 2019-05-25 NOTE — ED Triage Notes (Signed)
Pt reports left flank pain, N/V. Pt reports kindey stone hx and pain beng same

## 2019-05-26 LAB — URINE CULTURE
Culture: NO GROWTH
Special Requests: NORMAL

## 2019-06-01 DIAGNOSIS — E1122 Type 2 diabetes mellitus with diabetic chronic kidney disease: Secondary | ICD-10-CM | POA: Diagnosis not present

## 2019-06-01 DIAGNOSIS — N183 Chronic kidney disease, stage 3 (moderate): Secondary | ICD-10-CM | POA: Diagnosis not present

## 2019-06-01 DIAGNOSIS — I10 Essential (primary) hypertension: Secondary | ICD-10-CM | POA: Diagnosis not present

## 2019-06-11 DIAGNOSIS — K529 Noninfective gastroenteritis and colitis, unspecified: Secondary | ICD-10-CM | POA: Diagnosis not present

## 2019-10-05 DIAGNOSIS — F5104 Psychophysiologic insomnia: Secondary | ICD-10-CM | POA: Diagnosis not present

## 2020-02-06 DIAGNOSIS — G4709 Other insomnia: Secondary | ICD-10-CM | POA: Diagnosis not present

## 2020-05-07 DIAGNOSIS — I10 Essential (primary) hypertension: Secondary | ICD-10-CM | POA: Diagnosis not present

## 2020-05-07 DIAGNOSIS — N183 Chronic kidney disease, stage 3 unspecified: Secondary | ICD-10-CM | POA: Diagnosis not present

## 2020-05-07 DIAGNOSIS — E1122 Type 2 diabetes mellitus with diabetic chronic kidney disease: Secondary | ICD-10-CM | POA: Diagnosis not present

## 2020-05-13 DIAGNOSIS — E7800 Pure hypercholesterolemia, unspecified: Secondary | ICD-10-CM | POA: Diagnosis not present

## 2020-05-13 DIAGNOSIS — G4700 Insomnia, unspecified: Secondary | ICD-10-CM | POA: Diagnosis not present

## 2020-05-13 DIAGNOSIS — Z1211 Encounter for screening for malignant neoplasm of colon: Secondary | ICD-10-CM | POA: Diagnosis not present

## 2020-05-13 DIAGNOSIS — I10 Essential (primary) hypertension: Secondary | ICD-10-CM | POA: Diagnosis not present

## 2020-05-13 DIAGNOSIS — N183 Chronic kidney disease, stage 3 unspecified: Secondary | ICD-10-CM | POA: Diagnosis not present

## 2020-05-13 DIAGNOSIS — E663 Overweight: Secondary | ICD-10-CM | POA: Diagnosis not present

## 2020-05-13 DIAGNOSIS — Z6826 Body mass index (BMI) 26.0-26.9, adult: Secondary | ICD-10-CM | POA: Diagnosis not present

## 2020-05-13 DIAGNOSIS — E1122 Type 2 diabetes mellitus with diabetic chronic kidney disease: Secondary | ICD-10-CM | POA: Diagnosis not present

## 2020-06-06 DIAGNOSIS — G894 Chronic pain syndrome: Secondary | ICD-10-CM | POA: Diagnosis not present

## 2020-07-03 DIAGNOSIS — U071 COVID-19: Secondary | ICD-10-CM | POA: Diagnosis not present

## 2020-07-03 DIAGNOSIS — Z20822 Contact with and (suspected) exposure to covid-19: Secondary | ICD-10-CM | POA: Diagnosis not present

## 2020-07-06 ENCOUNTER — Other Ambulatory Visit (HOSPITAL_COMMUNITY): Payer: Self-pay | Admitting: Oncology

## 2020-07-06 DIAGNOSIS — U071 COVID-19: Secondary | ICD-10-CM

## 2020-07-06 NOTE — Progress Notes (Signed)
I connected by phone with  Mr. Jacober  to discuss the potential use of an new treatment for mild to moderate COVID-19 viral infection in non-hospitalized patients.   This patient is a age/sex that meets the FDA criteria for Emergency Use Authorization of casirivimab\imdevimab.  Has a (+) direct SARS-CoV-2 viral test result 1. Has mild or moderate COVID-19  2. Is ? 68 years of age and weighs ? 40 kg 3. Is NOT hospitalized due to COVID-19 4. Is NOT requiring oxygen therapy or requiring an increase in baseline oxygen flow rate due to COVID-19 5. Is within 10 days of symptom onset 6. Has at least one of the high risk factor(s) for progression to severe COVID-19 and/or hospitalization as defined in EUA. ? Specific high risk criteria : Past Medical History:  Diagnosis Date  . Cancer ([place])   . Chronic back pain   . Chronic neck pain   . Gout   . Hypertension   . Renal disorder   ?    Symptom onset  07/03/20   I have spoken and communicated the following to the patient or parent/caregiver:   1. FDA has authorized the emergency use of casirivimab\imdevimab for the treatment of mild to moderate COVID-19 in adults and pediatric patients with positive results of direct SARS-CoV-2 viral testing who are 28 years of age and older weighing at least 40 kg, and who are at high risk for progressing to severe COVID-19 and/or hospitalization.   2. The significant known and potential risks and benefits of casirivimab\imdevimab, and the extent to which such potential risks and benefits are unknown.   3. Information on available alternative treatments and the risks and benefits of those alternatives, including clinical trials.   4. Patients treated with casirivimab\imdevimab should continue to self-isolate and use infection control measures (e.g., wear mask, isolate, social distance, avoid sharing personal items, clean and disinfect "high touch" surfaces, and frequent handwashing) according to CDC  guidelines.    5. The patient or parent/caregiver has the option to accept or refuse casirivimab\imdevimab .   After reviewing this information with the patient, The patient agreed to proceed with receiving casirivimab\imdevimab infusion and will be provided a copy of the Fact sheet prior to receiving the infusion.Rulon Abide, AGNP-C [phone] ([place])

## 2020-07-09 ENCOUNTER — Ambulatory Visit (HOSPITAL_COMMUNITY)
Admission: RE | Admit: 2020-07-09 | Discharge: 2020-07-09 | Disposition: A | Discharge status: Home / Self Care | Payer: Medicare Other | Source: Ambulatory Visit | Attending: Pulmonary Disease | Admitting: Pulmonary Disease

## 2020-07-09 DIAGNOSIS — U071 COVID-19: Secondary | ICD-10-CM

## 2020-07-09 MED ORDER — METHYLPREDNISOLONE SODIUM SUCC 125 MG IJ SOLR: 125.0000 mg | Freq: Once | INTRAMUSCULAR | Status: DC | PRN

## 2020-07-09 MED ORDER — SODIUM CHLORIDE 0.9 % IV SOLN: Freq: Once | INTRAVENOUS | Status: DC

## 2020-07-09 MED ORDER — DIPHENHYDRAMINE HCL 50 MG/ML IJ SOLN: 50.0000 mg | Freq: Once | INTRAMUSCULAR | Status: DC | PRN

## 2020-07-09 MED ORDER — ALBUTEROL SULFATE HFA 108 (90 BASE) MCG/ACT IN AERS: 2.0000 | INHALATION_SPRAY | Freq: Once | RESPIRATORY_TRACT | Status: DC | PRN

## 2020-07-09 MED ORDER — SOTROVIMAB 500 MG/8ML IV SOLN
500.0000 mg | Freq: Once | INTRAVENOUS | Status: AC
  Administered 2020-07-09: 500 mg via INTRAVENOUS

## 2020-07-09 MED ORDER — FAMOTIDINE IN NACL 20-0.9 MG/50ML-% IV SOLN: 20.0000 mg | Freq: Once | INTRAVENOUS | Status: DC | PRN

## 2020-07-09 MED ORDER — SODIUM CHLORIDE 0.9 % IV SOLN: INTRAVENOUS | Status: DC | PRN

## 2020-07-09 MED ORDER — EPINEPHRINE 0.3 MG/0.3ML IJ SOAJ: 0.3000 mg | Freq: Once | INTRAMUSCULAR | Status: DC | PRN

## 2020-07-09 NOTE — Discharge Instructions (Signed)

## 2020-07-09 NOTE — Progress Notes (Signed)
  Diagnosis: COVID-19  Physician: Dr. Joya Gaskins  Procedure: [facility] Med: sotrovimab infusion - Provided patient with sotrovimab fact sheet for patients, parents and caregivers prior to infusion.  Complications: No immediate complications noted.  Discharge: Discharged home   Evan Jimenez 07/09/2020

## 2020-07-15 ENCOUNTER — Emergency Department (HOSPITAL_COMMUNITY)
Admission: EM | Admit: 2020-07-15 | Discharge: 2020-07-15 | Disposition: A | Discharge status: Home / Self Care | Payer: Medicare Other | Attending: Emergency Medicine | Admitting: Emergency Medicine

## 2020-07-15 ENCOUNTER — Encounter (HOSPITAL_COMMUNITY): Payer: Self-pay

## 2020-07-15 ENCOUNTER — Other Ambulatory Visit: Payer: Self-pay

## 2020-07-15 DIAGNOSIS — Z79899 Other long term (current) drug therapy: Secondary | ICD-10-CM | POA: Diagnosis not present

## 2020-07-15 DIAGNOSIS — I10 Essential (primary) hypertension: Secondary | ICD-10-CM | POA: Diagnosis not present

## 2020-07-15 DIAGNOSIS — I159 Secondary hypertension, unspecified: Secondary | ICD-10-CM

## 2020-07-15 DIAGNOSIS — E860 Dehydration: Secondary | ICD-10-CM | POA: Diagnosis not present

## 2020-07-15 DIAGNOSIS — Z8616 Personal history of COVID-19: Secondary | ICD-10-CM | POA: Insufficient documentation

## 2020-07-15 DIAGNOSIS — R55 Syncope and collapse: Secondary | ICD-10-CM | POA: Diagnosis not present

## 2020-07-15 DIAGNOSIS — R531 Weakness: Secondary | ICD-10-CM | POA: Diagnosis present

## 2020-07-15 LAB — CBC WITH DIFFERENTIAL/PLATELET
Abs Immature Granulocytes: 0.02 10*3/uL (ref 0.00–0.07)
Basophils Absolute: 0 10*3/uL (ref 0.0–0.1)
Basophils Relative: 0 %
Eosinophils Absolute: 0 10*3/uL (ref 0.0–0.5)
Eosinophils Relative: 1 %
HCT: 39.5 % (ref 39.0–52.0)
Hemoglobin: 12.7 g/dL — ABNORMAL LOW (ref 13.0–17.0)
Immature Granulocytes: 0 %
Lymphocytes Relative: 16 %
Lymphs Abs: 1.2 10*3/uL (ref 0.7–4.0)
MCH: 28.6 pg (ref 26.0–34.0)
MCHC: 32.2 g/dL (ref 30.0–36.0)
MCV: 89 fL (ref 80.0–100.0)
Monocytes Absolute: 0.6 10*3/uL (ref 0.1–1.0)
Monocytes Relative: 8 %
Neutro Abs: 5.7 10*3/uL (ref 1.7–7.7)
Neutrophils Relative %: 75 %
Platelets: 217 10*3/uL (ref 150–400)
RBC: 4.44 MIL/uL (ref 4.22–5.81)
RDW: 12.7 % (ref 11.5–15.5)
WBC: 7.5 10*3/uL (ref 4.0–10.5)
nRBC: 0 % (ref 0.0–0.2)

## 2020-07-15 LAB — BASIC METABOLIC PANEL
Anion gap: 8 (ref 5–15)
BUN: 19 mg/dL (ref 8–23)
CO2: 28 mmol/L (ref 22–32)
Calcium: 8.9 mg/dL (ref 8.9–10.3)
Chloride: 100 mmol/L (ref 98–111)
Creatinine, Ser: 1.35 mg/dL — ABNORMAL HIGH (ref 0.61–1.24)
GFR, Estimated: 57 mL/min — ABNORMAL LOW (ref >60)
Glucose, Bld: 188 mg/dL — ABNORMAL HIGH (ref 70–99)
Potassium: 3.9 mmol/L (ref 3.5–5.1)
Sodium: 136 mmol/L (ref 135–145)

## 2020-07-15 MED ORDER — CLONIDINE HCL 0.1 MG PO TABS
0.1000 mg | ORAL_TABLET | Freq: Once | ORAL | Status: AC
  Administered 2020-07-15: 0.1 mg via ORAL
  Filled 2020-07-15: qty 1

## 2020-07-15 MED ORDER — SODIUM CHLORIDE 0.9 % IV BOLUS
1000.0000 mL | Freq: Once | INTRAVENOUS | Status: AC
  Administered 2020-07-15: 1000 mL via INTRAVENOUS

## 2020-07-15 MED ORDER — ENALAPRIL MALEATE 5 MG PO TABS
5.0000 mg | ORAL_TABLET | Freq: Once | ORAL | Status: AC
  Administered 2020-07-15: 5 mg via ORAL
  Filled 2020-07-15: qty 1

## 2020-07-15 MED ORDER — SODIUM CHLORIDE 0.9 % IV SOLN: INTRAVENOUS | Status: DC

## 2020-07-15 NOTE — Discharge Instructions (Signed)
Take your blood pressure medication as directed

## 2020-07-15 NOTE — ED Notes (Signed)
Pt. Made aware for the need of urine specimen. 

## 2020-07-15 NOTE — ED Provider Notes (Signed)
[facility] DEPT Provider Note   CSN: [id number] Arrival date & time: 07/15/20  1859     History No chief complaint on file.   Evan Jimenez is a 69 y.o. male.  69 year old male presents with increasing weakness and concern for dehydration.  He denies any syncope or near syncope.  He has not had any chest pain or shortness of breath.  States that he was diagnosed with Covid 14 days ago and did receive monoclonal antibody.  Since that time he has had no fever or chills.  No diarrhea.  States he has not had much of an appetite and has not been compliant with his antihypertensives.  Patient states he had a 15 pound weight loss in the past 2 weeks        Past Medical History:  Diagnosis Date  . Cancer ([place])   . Chronic back pain   . Chronic neck pain   . Gout   . Hypertension   . Renal disorder     There are no problems to display for this patient.   Past Surgical History:  Procedure Laterality Date  . skin cancer removal         Family History  Problem Relation Age of Onset  . Hypertension Mother   . Dementia Mother     Social History   Tobacco Use  . Smoking status: Never Smoker  . Smokeless tobacco: Never Used  Vaping Use  . Vaping Use: Never used  Substance Use Topics  . Alcohol use: No  . Drug use: No    Home Medications Prior to Admission medications   Medication Sig Start Date End Date Taking? Authorizing Provider  atorvastatin (LIPITOR) 10 MG tablet Take 10 mg by mouth daily. 02/22/19   [provider]  cyclobenzaprine (FLEXERIL) 5 MG tablet Take 1 tablet (5 mg total) by mouth 3 (three) times daily as needed for muscle spasms. Patient not taking: Reported on 04/25/2018 04/09/14   Junius Creamer, NP  enalapril (VASOTEC) 5 MG tablet Take 10 mg by mouth daily.  05/04/16   [provider]  glimepiride (AMARYL) 4 MG tablet Take 4 mg by mouth daily. 02/27/19   [provider]  HYDROmorphone (DILAUDID) 4  MG tablet Take 1 tablet (4 mg total) by mouth every 6 (six) hours as needed for severe pain. Patient not taking: Reported on 04/25/2018 06/13/16   Milton Ferguson, MD  ibuprofen (ADVIL,MOTRIN) 200 MG tablet Take 600 mg by mouth daily as needed (severe pain).    [provider]  ibuprofen (ADVIL,MOTRIN) 600 MG tablet Take 1 tablet (600 mg total) by mouth every 8 (eight) hours as needed. Patient not taking: Reported on 04/25/2018 04/09/14   Junius Creamer, NP  ondansetron (ZOFRAN) 4 MG tablet Take 4 mg by mouth daily. 05/08/19   [provider]  Oxycodone HCl 10 MG TABS Take 10 mg by mouth every 6 (six) hours as needed for pain. Up to 5 times daily 05/10/19   [provider]  oxyCODONE-acetaminophen (PERCOCET/ROXICET) 5-325 MG tablet Take 1-2 tablets by mouth every 6 (six) hours as needed for severe pain. 04/25/18   Blanchie Dessert, MD  sildenafil (REVATIO) 20 MG tablet Take 20-100 mg by mouth as needed for erectile dysfunction. 05/16/19   [provider]  traZODone (DESYREL) 100 MG tablet Take 100 mg by mouth at bedtime. 04/06/18   [provider]    Allergies    Patient has no known allergies.  Review  of Systems   Review of Systems  All other systems reviewed and are negative.   Physical Exam Updated Vital Signs BP (!) 192/111 (BP Location: Left Arm)   Pulse 98   Temp 98.3 F (36.8 C) (Oral)   Resp 20   SpO2 99%   Physical Exam Vitals and nursing note reviewed.  Constitutional:      General: He is not in acute distress.    Appearance: Normal appearance. He is well-developed. He is not toxic-appearing.  HENT:     Head: Normocephalic and atraumatic.  Eyes:     General: Lids are normal.     Conjunctiva/sclera: Conjunctivae normal.     Pupils: Pupils are equal, round, and reactive to light.  Neck:     Thyroid: No thyroid mass.     Trachea: No tracheal deviation.  Cardiovascular:     Rate and Rhythm: Normal rate and regular rhythm.     Heart  sounds: Normal heart sounds. No murmur heard.  No gallop.   Pulmonary:     Effort: Pulmonary effort is normal. No respiratory distress.     Breath sounds: Normal breath sounds. No stridor. No decreased breath sounds, wheezing, rhonchi or rales.  Abdominal:     General: Bowel sounds are normal. There is no distension.     Palpations: Abdomen is soft.     Tenderness: There is no abdominal tenderness. There is no rebound.  Musculoskeletal:        General: No tenderness. Normal range of motion.     Cervical back: Normal range of motion and neck supple.  Skin:    General: Skin is warm and dry.     Findings: No abrasion or rash.  Neurological:     Mental Status: He is alert and oriented to person, place, and time.     GCS: GCS eye subscore is 4. GCS verbal subscore is 5. GCS motor subscore is 6.     Cranial Nerves: No cranial nerve deficit.     Sensory: No sensory deficit.  Psychiatric:        Speech: Speech normal.        Behavior: Behavior normal.     ED Results / Procedures / Treatments   Labs (all labs ordered are listed, but only abnormal results are displayed) Labs Reviewed  URINE CULTURE  CBC WITH DIFFERENTIAL/PLATELET  BASIC METABOLIC PANEL  URINALYSIS, ROUTINE W REFLEX MICROSCOPIC    EKG None  Radiology No results found.  Procedures Procedures (including critical care time)  Medications Ordered in ED Medications  sodium chloride 0.9 % bolus 1,000 mL (has no administration in time range)  0.9 %  sodium chloride infusion (has no administration in time range)  cloNIDine (CATAPRES) tablet 0.1 mg (has no administration in time range)  enalapril (VASOTEC) tablet 5 mg (has no administration in time range)    ED Course  I have reviewed the triage vital signs and the nursing notes.  Pertinent labs & imaging results that were available during my care of the patient were reviewed by me and considered in my medical decision making (see chart for details).    MDM  Rules/Calculators/A&P                          Patient given IV fluids as well as medications for his high blood pressure here.  Encouraged to take his medications and will be discharged Final Clinical Impression(s) / ED Diagnoses Final diagnoses:  None  Rx / DC Orders ED Discharge Orders    None       Lacretia Leigh, MD 07/15/20 2203

## 2020-07-15 NOTE — ED Triage Notes (Signed)
Pt states he was dx with COVID on Oct 28th and since then he's had no appetite and feels dehydrated, he also states that he's lost 15 pounds in two weeks

## 2020-07-15 NOTE — ED Triage Notes (Signed)
Pt also states that he hasn't been able to take his medications because he can't drink anything

## 2020-08-15 DIAGNOSIS — E7800 Pure hypercholesterolemia, unspecified: Secondary | ICD-10-CM | POA: Diagnosis not present

## 2020-08-15 DIAGNOSIS — Z6826 Body mass index (BMI) 26.0-26.9, adult: Secondary | ICD-10-CM | POA: Diagnosis not present

## 2020-08-15 DIAGNOSIS — G4700 Insomnia, unspecified: Secondary | ICD-10-CM | POA: Diagnosis not present

## 2020-08-15 DIAGNOSIS — I10 Essential (primary) hypertension: Secondary | ICD-10-CM | POA: Diagnosis not present

## 2020-08-15 DIAGNOSIS — E1122 Type 2 diabetes mellitus with diabetic chronic kidney disease: Secondary | ICD-10-CM | POA: Diagnosis not present

## 2020-08-15 DIAGNOSIS — E663 Overweight: Secondary | ICD-10-CM | POA: Diagnosis not present

## 2020-08-15 DIAGNOSIS — D649 Anemia, unspecified: Secondary | ICD-10-CM | POA: Diagnosis not present

## 2020-08-15 DIAGNOSIS — Z1211 Encounter for screening for malignant neoplasm of colon: Secondary | ICD-10-CM | POA: Diagnosis not present

## 2020-08-15 DIAGNOSIS — N183 Chronic kidney disease, stage 3 unspecified: Secondary | ICD-10-CM | POA: Diagnosis not present

## 2020-09-10 DIAGNOSIS — Z79899 Other long term (current) drug therapy: Secondary | ICD-10-CM | POA: Diagnosis not present

## 2020-09-10 DIAGNOSIS — Z5181 Encounter for therapeutic drug level monitoring: Secondary | ICD-10-CM | POA: Diagnosis not present

## 2020-09-10 DIAGNOSIS — G894 Chronic pain syndrome: Secondary | ICD-10-CM | POA: Diagnosis not present

## 2021-01-07 DIAGNOSIS — M5030 Other cervical disc degeneration, unspecified cervical region: Secondary | ICD-10-CM | POA: Diagnosis not present

## 2021-01-07 DIAGNOSIS — Z79891 Long term (current) use of opiate analgesic: Secondary | ICD-10-CM | POA: Diagnosis not present

## 2021-01-07 DIAGNOSIS — M5459 Other low back pain: Secondary | ICD-10-CM | POA: Diagnosis not present

## 2021-01-12 DIAGNOSIS — M19049 Primary osteoarthritis, unspecified hand: Secondary | ICD-10-CM | POA: Diagnosis not present

## 2021-01-12 DIAGNOSIS — S63502A Unspecified sprain of left wrist, initial encounter: Secondary | ICD-10-CM | POA: Diagnosis not present

## 2021-01-14 DIAGNOSIS — N183 Chronic kidney disease, stage 3 unspecified: Secondary | ICD-10-CM | POA: Diagnosis not present

## 2021-01-14 DIAGNOSIS — Z125 Encounter for screening for malignant neoplasm of prostate: Secondary | ICD-10-CM | POA: Diagnosis not present

## 2021-01-14 DIAGNOSIS — D649 Anemia, unspecified: Secondary | ICD-10-CM | POA: Diagnosis not present

## 2021-01-14 DIAGNOSIS — Z1211 Encounter for screening for malignant neoplasm of colon: Secondary | ICD-10-CM | POA: Diagnosis not present

## 2021-01-14 DIAGNOSIS — E7800 Pure hypercholesterolemia, unspecified: Secondary | ICD-10-CM | POA: Diagnosis not present

## 2021-01-14 DIAGNOSIS — I10 Essential (primary) hypertension: Secondary | ICD-10-CM | POA: Diagnosis not present

## 2021-01-14 DIAGNOSIS — E1122 Type 2 diabetes mellitus with diabetic chronic kidney disease: Secondary | ICD-10-CM | POA: Diagnosis not present

## 2021-01-14 DIAGNOSIS — Z6826 Body mass index (BMI) 26.0-26.9, adult: Secondary | ICD-10-CM | POA: Diagnosis not present

## 2021-01-14 DIAGNOSIS — I700 Atherosclerosis of aorta: Secondary | ICD-10-CM | POA: Diagnosis not present

## 2021-01-14 DIAGNOSIS — E663 Overweight: Secondary | ICD-10-CM | POA: Diagnosis not present

## 2021-01-14 DIAGNOSIS — Z7984 Long term (current) use of oral hypoglycemic drugs: Secondary | ICD-10-CM | POA: Diagnosis not present

## 2021-01-14 DIAGNOSIS — G4700 Insomnia, unspecified: Secondary | ICD-10-CM | POA: Diagnosis not present

## 2021-01-19 DIAGNOSIS — S63599A Other specified sprain of unspecified wrist, initial encounter: Secondary | ICD-10-CM | POA: Insufficient documentation

## 2021-01-19 DIAGNOSIS — S63592D Other specified sprain of left wrist, subsequent encounter: Secondary | ICD-10-CM | POA: Diagnosis not present

## 2021-01-19 DIAGNOSIS — S63502A Unspecified sprain of left wrist, initial encounter: Secondary | ICD-10-CM | POA: Insufficient documentation

## 2021-01-27 DIAGNOSIS — M25532 Pain in left wrist: Secondary | ICD-10-CM | POA: Diagnosis not present

## 2021-01-27 DIAGNOSIS — S63502A Unspecified sprain of left wrist, initial encounter: Secondary | ICD-10-CM | POA: Diagnosis not present

## 2021-01-27 DIAGNOSIS — S63592D Other specified sprain of left wrist, subsequent encounter: Secondary | ICD-10-CM | POA: Diagnosis not present

## 2021-05-13 DIAGNOSIS — G894 Chronic pain syndrome: Secondary | ICD-10-CM | POA: Diagnosis not present

## 2021-07-13 IMAGING — CT CT RENAL STONE PROTOCOL
2 of 4 series · 15 of 46 positions shown, 17 images · non-contrast
Comparison: Abdominopelvic CT 06/13/2016.

CLINICAL DATA: Left flank pain with nausea and vomiting. History of
kidney stones.

EXAM:
CT ABDOMEN AND PELVIS WITHOUT CONTRAST
TECHNIQUE: Multidetector CT imaging of the abdomen and pelvis was performed
following the standard protocol without IV contrast.

[Series 2: axial st · axial · 0.81mm/px · z∈[-553,-78]mm · 12 of 109 slices shown, 14 images]
[im 7/109  soft-tissue]
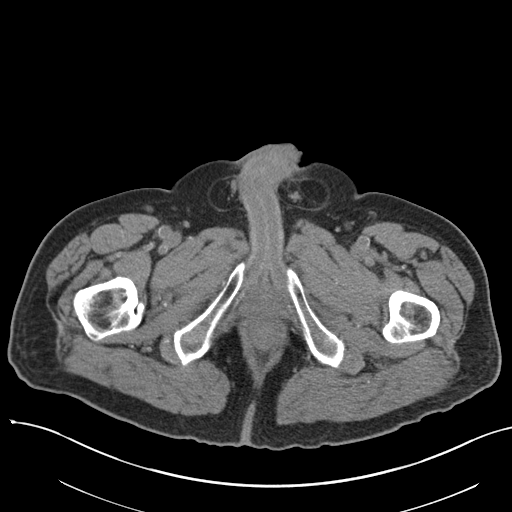
[im 7/109  bone]
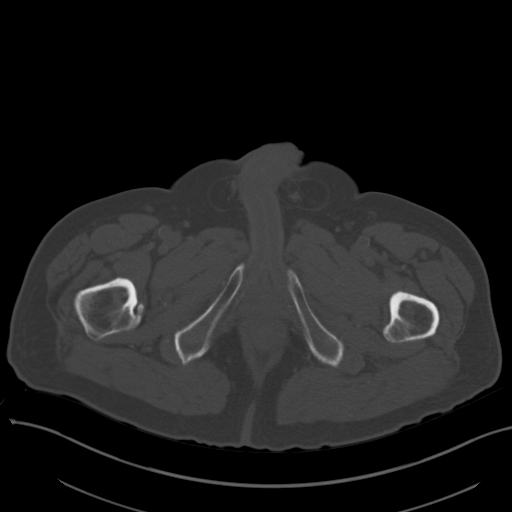
[im 14/109  soft-tissue]
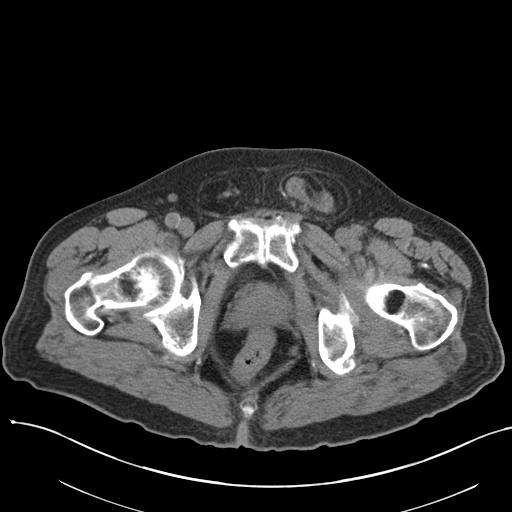
[im 28/109  soft-tissue]
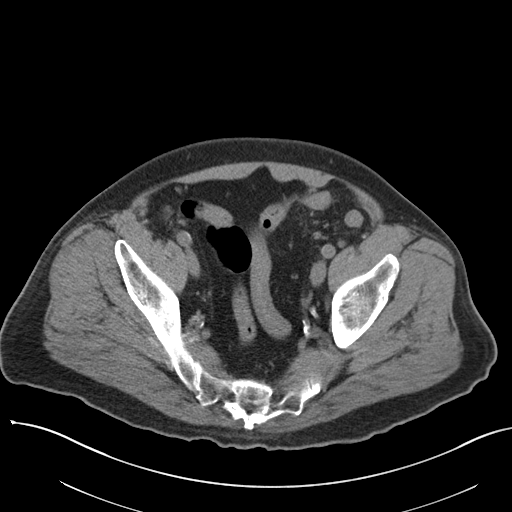
[im 34/109  soft-tissue]
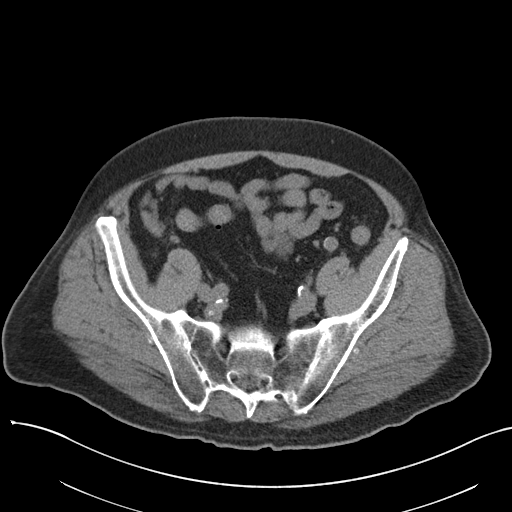
[im 41/109  soft-tissue]
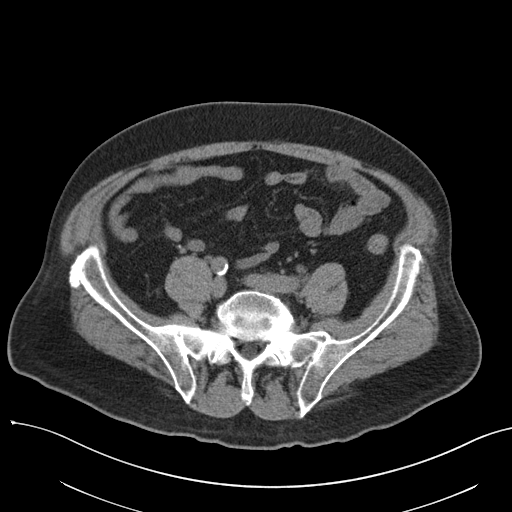
[im 48/109  soft-tissue]
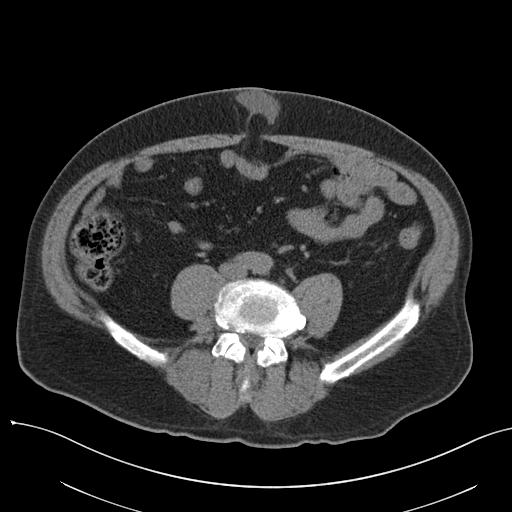
[im 61/109  soft-tissue]
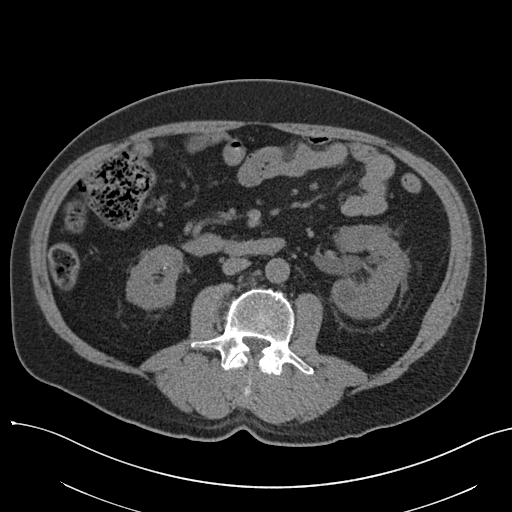
[im 68/109  soft-tissue]
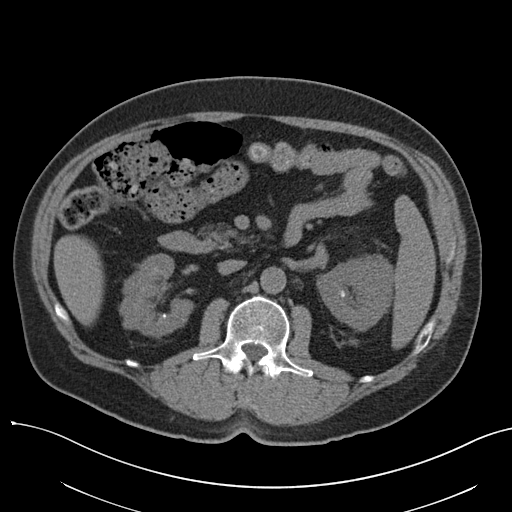
[im 75/109  soft-tissue]
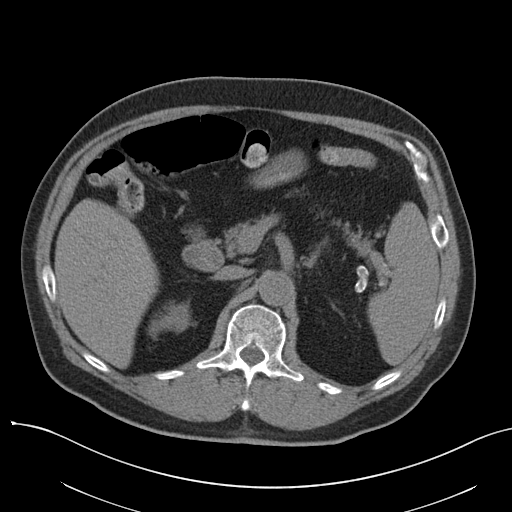
[im 75/109  bone]
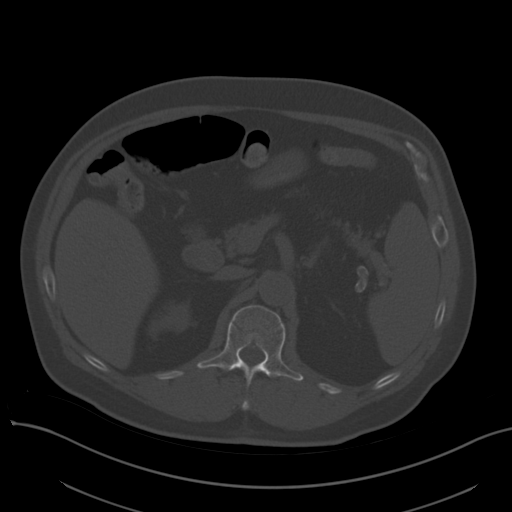
[im 82/109  soft-tissue]
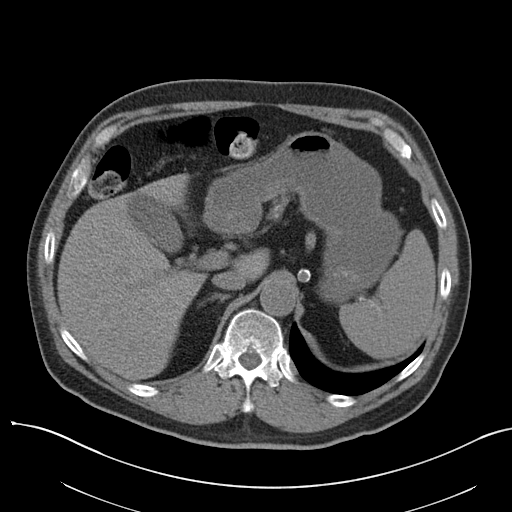
[im 95/109  soft-tissue]
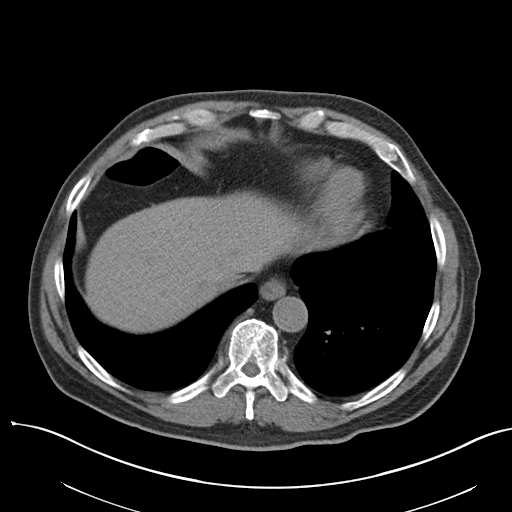
[im 102/109  soft-tissue]
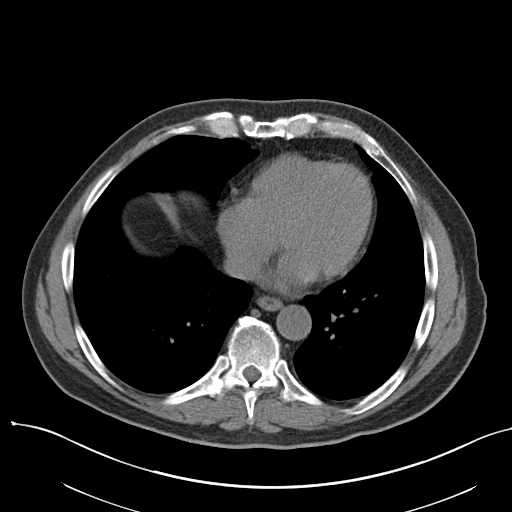

[Series 4: coronal · coronal · 0.92mm/px · 3 of 156 slices shown]
[im 52/156  soft-tissue]
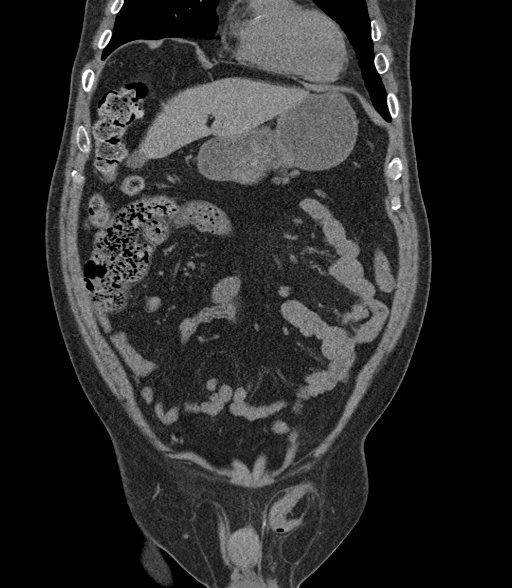
[im 69/156  soft-tissue]
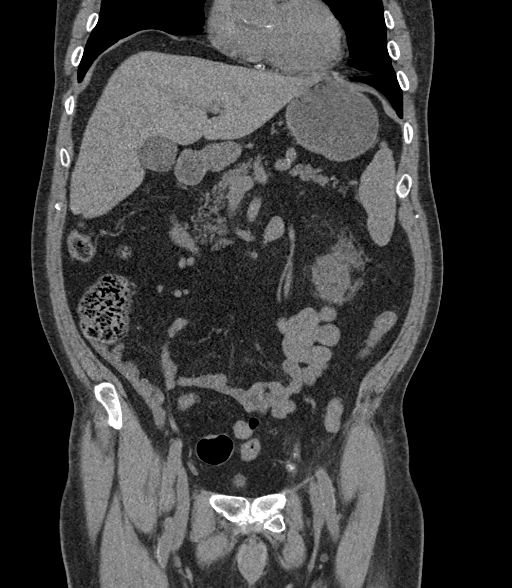
[im 87/156  soft-tissue]
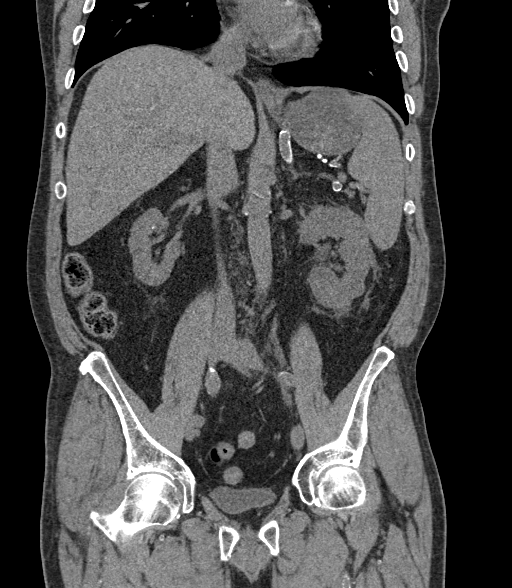

[15 of 46 positions shown; findings below may reference images not displayed]

FINDINGS: Lower chest: 6 mm subpleural right lower lobe nodule on image [DATE] is
stable, consistent with a benign finding. The lung bases are
otherwise clear. There is no pleural or pericardial effusion. There
is coronary artery atherosclerosis.

Hepatobiliary: The liver appears unremarkable as imaged in the
noncontrast state. No evidence of gallstones, gallbladder wall
thickening or biliary dilatation.

Pancreas: Unremarkable. No pancreatic ductal dilatation or
surrounding inflammatory changes.

Spleen: Normal in size without focal abnormality.

Adrenals/Urinary Tract: Both adrenal glands appear normal. There is
progressive bilateral nephrolithiasis compared with the previous
study. In the upper pole of the right kidney, there is a 6 mm
calculus on image 41/2. There are calculi measuring 5 mm in the
upper pole of the left kidney on image 43/2 and 4 mm in the lower
pole on image 55/2. There is new asymmetric perinephric soft tissue
stranding on the left and mild left-sided hydronephrosis secondary
to an obstructing ureter in the distal left calculus. This measures
3 mm on image 84/2 and is located 3.5 cm above the ureterovesical
junction. There is no right-sided hydronephrosis. Underlying small
bilateral renal cysts are grossly stable. The bladder appears
normal.

Stomach/Bowel: No evidence of bowel wall thickening, distention or
surrounding inflammatory change. The sigmoid colon extends into a
left inguinal hernia. No evidence of incarceration or obstruction.
There are mild distal colonic diverticular changes. The cecum is
located in the right mid abdomen.

Vascular/Lymphatic: There are no enlarged abdominal or pelvic lymph
nodes. Mild aortic and branch vessel atherosclerosis.

Reproductive: The prostate gland and seminal vesicles appear normal.

Other: There are bilateral inguinal hernias. As above, there is new
extension of the sigmoid colon into the left inguinal hernia. No
evidence of bowel obstruction or incarceration. No ascites or free
air. An umbilical hernia contains slightly more fluid than on the
previous study.

Musculoskeletal: No acute or significant osseous findings.
IMPRESSION: 1. Partially obstructing 3 mm calculus in the distal left ureter.
2. Bilateral hydronephrosis, increased in size and number compared
with previous CT from [DATE]. Left inguinal hernia now contains a portion of the sigmoid colon.
No evidence of incarceration or bowel obstruction. An umbilical
hernia contains slightly more fluid compared with the previous
study.
4.  Aortic Atherosclerosis ([id number]-NSX.X).

## 2021-09-09 DIAGNOSIS — Z79899 Other long term (current) drug therapy: Secondary | ICD-10-CM | POA: Diagnosis not present

## 2021-09-09 DIAGNOSIS — Z5181 Encounter for therapeutic drug level monitoring: Secondary | ICD-10-CM | POA: Diagnosis not present

## 2021-12-14 DIAGNOSIS — E1122 Type 2 diabetes mellitus with diabetic chronic kidney disease: Secondary | ICD-10-CM | POA: Diagnosis not present

## 2021-12-14 DIAGNOSIS — E7800 Pure hypercholesterolemia, unspecified: Secondary | ICD-10-CM | POA: Diagnosis not present

## 2021-12-14 DIAGNOSIS — E538 Deficiency of other specified B group vitamins: Secondary | ICD-10-CM | POA: Diagnosis not present

## 2021-12-14 DIAGNOSIS — Z0000 Encounter for general adult medical examination without abnormal findings: Secondary | ICD-10-CM | POA: Diagnosis not present

## 2021-12-14 DIAGNOSIS — I129 Hypertensive chronic kidney disease with stage 1 through stage 4 chronic kidney disease, or unspecified chronic kidney disease: Secondary | ICD-10-CM | POA: Diagnosis not present

## 2021-12-16 DIAGNOSIS — E7800 Pure hypercholesterolemia, unspecified: Secondary | ICD-10-CM | POA: Diagnosis not present

## 2021-12-16 DIAGNOSIS — E538 Deficiency of other specified B group vitamins: Secondary | ICD-10-CM | POA: Diagnosis not present

## 2021-12-16 DIAGNOSIS — I129 Hypertensive chronic kidney disease with stage 1 through stage 4 chronic kidney disease, or unspecified chronic kidney disease: Secondary | ICD-10-CM | POA: Diagnosis not present

## 2021-12-16 DIAGNOSIS — E1122 Type 2 diabetes mellitus with diabetic chronic kidney disease: Secondary | ICD-10-CM | POA: Diagnosis not present

## 2021-12-16 DIAGNOSIS — N183 Chronic kidney disease, stage 3 unspecified: Secondary | ICD-10-CM | POA: Diagnosis not present

## 2021-12-16 DIAGNOSIS — G4700 Insomnia, unspecified: Secondary | ICD-10-CM | POA: Diagnosis not present

## 2022-01-18 DIAGNOSIS — M5459 Other low back pain: Secondary | ICD-10-CM | POA: Diagnosis not present

## 2022-01-18 DIAGNOSIS — M542 Cervicalgia: Secondary | ICD-10-CM | POA: Diagnosis not present

## 2022-02-10 DIAGNOSIS — M5459 Other low back pain: Secondary | ICD-10-CM | POA: Diagnosis not present

## 2022-02-10 DIAGNOSIS — M542 Cervicalgia: Secondary | ICD-10-CM | POA: Diagnosis not present

## 2022-03-01 DIAGNOSIS — M79672 Pain in left foot: Secondary | ICD-10-CM | POA: Diagnosis not present

## 2022-03-01 DIAGNOSIS — M25572 Pain in left ankle and joints of left foot: Secondary | ICD-10-CM | POA: Diagnosis not present

## 2022-03-12 DIAGNOSIS — M79672 Pain in left foot: Secondary | ICD-10-CM | POA: Diagnosis not present

## 2022-03-12 DIAGNOSIS — M25572 Pain in left ankle and joints of left foot: Secondary | ICD-10-CM | POA: Insufficient documentation

## 2022-04-15 DIAGNOSIS — M5459 Other low back pain: Secondary | ICD-10-CM | POA: Diagnosis not present

## 2022-04-15 DIAGNOSIS — M542 Cervicalgia: Secondary | ICD-10-CM | POA: Diagnosis not present

## 2022-08-19 DIAGNOSIS — M542 Cervicalgia: Secondary | ICD-10-CM | POA: Diagnosis not present

## 2022-08-19 DIAGNOSIS — G894 Chronic pain syndrome: Secondary | ICD-10-CM | POA: Diagnosis not present

## 2022-08-19 DIAGNOSIS — M5030 Other cervical disc degeneration, unspecified cervical region: Secondary | ICD-10-CM | POA: Diagnosis not present

## 2022-08-19 DIAGNOSIS — M5136 Other intervertebral disc degeneration, lumbar region: Secondary | ICD-10-CM | POA: Diagnosis not present

## 2022-08-19 DIAGNOSIS — M5459 Other low back pain: Secondary | ICD-10-CM | POA: Diagnosis not present

## 2022-11-17 ENCOUNTER — Encounter: Payer: Self-pay | Admitting: Family

## 2022-11-17 ENCOUNTER — Ambulatory Visit (INDEPENDENT_AMBULATORY_CARE_PROVIDER_SITE_OTHER): Payer: Medicare Other | Admitting: Family

## 2022-11-17 VITALS — BP 162/102 | HR 96 | Temp 97.7°F | Ht 74.0 in | Wt 216.4 lb

## 2022-11-17 DIAGNOSIS — D649 Anemia, unspecified: Secondary | ICD-10-CM | POA: Diagnosis not present

## 2022-11-17 DIAGNOSIS — Z85820 Personal history of malignant melanoma of skin: Secondary | ICD-10-CM | POA: Diagnosis not present

## 2022-11-17 DIAGNOSIS — Z1211 Encounter for screening for malignant neoplasm of colon: Secondary | ICD-10-CM | POA: Diagnosis not present

## 2022-11-17 DIAGNOSIS — Z6827 Body mass index (BMI) 27.0-27.9, adult: Secondary | ICD-10-CM

## 2022-11-17 DIAGNOSIS — N1831 Chronic kidney disease, stage 3a: Secondary | ICD-10-CM

## 2022-11-17 DIAGNOSIS — I700 Atherosclerosis of aorta: Secondary | ICD-10-CM

## 2022-11-17 DIAGNOSIS — E1159 Type 2 diabetes mellitus with other circulatory complications: Secondary | ICD-10-CM

## 2022-11-17 DIAGNOSIS — G479 Sleep disorder, unspecified: Secondary | ICD-10-CM

## 2022-11-17 DIAGNOSIS — C4441 Basal cell carcinoma of skin of scalp and neck: Secondary | ICD-10-CM | POA: Insufficient documentation

## 2022-11-17 DIAGNOSIS — E1165 Type 2 diabetes mellitus with hyperglycemia: Secondary | ICD-10-CM | POA: Insufficient documentation

## 2022-11-17 DIAGNOSIS — E7800 Pure hypercholesterolemia, unspecified: Secondary | ICD-10-CM | POA: Diagnosis not present

## 2022-11-17 DIAGNOSIS — C4372 Malignant melanoma of left lower limb, including hip: Secondary | ICD-10-CM | POA: Insufficient documentation

## 2022-11-17 DIAGNOSIS — E1122 Type 2 diabetes mellitus with diabetic chronic kidney disease: Secondary | ICD-10-CM

## 2022-11-17 DIAGNOSIS — L989 Disorder of the skin and subcutaneous tissue, unspecified: Secondary | ICD-10-CM

## 2022-11-17 DIAGNOSIS — N529 Male erectile dysfunction, unspecified: Secondary | ICD-10-CM

## 2022-11-17 DIAGNOSIS — I152 Hypertension secondary to endocrine disorders: Secondary | ICD-10-CM

## 2022-11-17 DIAGNOSIS — Z85828 Personal history of other malignant neoplasm of skin: Secondary | ICD-10-CM | POA: Insufficient documentation

## 2022-11-17 DIAGNOSIS — L570 Actinic keratosis: Secondary | ICD-10-CM | POA: Insufficient documentation

## 2022-11-17 MED ORDER — SILDENAFIL CITRATE 20 MG PO TABS: 20.0000 mg | ORAL_TABLET | ORAL | 0 refills | Status: AC | PRN

## 2022-11-17 MED ORDER — ENALAPRIL MALEATE 20 MG PO TABS: 20.0000 mg | ORAL_TABLET | Freq: Every day | ORAL | 3 refills | Status: AC

## 2022-11-17 MED ORDER — TRAZODONE HCL 100 MG PO TABS: 100.0000 mg | ORAL_TABLET | Freq: Every day | ORAL | 3 refills | Status: AC

## 2022-11-17 MED ORDER — ENALAPRIL MALEATE 5 MG PO TABS: 10.0000 mg | ORAL_TABLET | Freq: Every day | ORAL | 3 refills | Status: DC

## 2022-11-17 NOTE — Patient Instructions (Addendum)
Start vasotec 20 mg once daily.   ------------------------------------  Start monitoring your blood pressure daily, around the same time of day, for the next 2-3 weeks.  Ensure that you have rested for 30 minutes prior to checking your blood pressure. Record your readings and bring them to your next visit.  ------------------------------------  I have created an order for lab work today during our visit.  Please schedule an appointment on your way out to return to the lab at your convenience. Please return fasting at your lab appointment (meaning you can only drink black coffee and or water prior to your appointment). I will reach out to you in regards to the labs when I receive the results.   A referral was placed today for dermatology  Please let us know if you have not heard back within 2 weeks about the referral.  Regards,   Kavonte Bearse FNP-C

## 2022-11-17 NOTE — Progress Notes (Signed)
New Patient Office Visit  Subjective:  Patient ID: Evan Jimenez, male    DOB: 1950/11/30  Age: 72 y.o. MRN: [id number]  CC:  Chief Complaint  Patient presents with   Establish Care    HPI ABID FASS is here to establish care as a new patient.  Oriented to practice routines and expectations.  Prior provider was: Kathyrn Lass at [facility]   Pt is without acute concerns.   chronic concerns:  Nausea here and there, few times every couple months. Takes zofran prn.   HTN: blood pressure 198 /102 has been without meds for about three months. No cp palp and or sob. Out of his vasotec 10 mg daily.   HLD: atorvastatin 10 mg once daily, as been about for about three months or so.   Chronic low back pain, sees Dr. Suella Broad. Takes percocet daily and flexeril twice daily to manage with chronic pain.   ED: takes sildenafil prn.   Sleep disorder: takes trazodone 100 mg once nightly to help with sleep.   DM2: glimepiride 4 mg tablet once daily. Overdue with eye exam however does have one scheduled in the next weeks.   Tetanus vaccination: states unknown date however in the last ten years.   Dermatology, h/o melanoma, has a few moles that he wants to get checked out.   Colonoscopy: pt declines however pt will do cologuard.   ROS: Negative unless specifically indicated above in HPI.   Current Outpatient Medications:    atorvastatin (LIPITOR) 10 MG tablet, Take 1 tablet by mouth daily., Disp: , Rfl:    cyclobenzaprine (FLEXERIL) 10 MG tablet, Take 1 tablet by mouth 3 (three) times daily as needed., Disp: , Rfl:    enalapril (VASOTEC) 20 MG tablet, Take 1 tablet (20 mg total) by mouth daily., Disp: 90 tablet, Rfl: 3   glimepiride (AMARYL) 4 MG tablet, Take 4 mg by mouth daily., Disp: , Rfl:    ibuprofen (ADVIL,MOTRIN) 600 MG tablet, Take 1 tablet (600 mg total) by mouth every 8 (eight) hours as needed., Disp: 30 tablet, Rfl: 0   ondansetron (ZOFRAN) 4 MG  tablet, Take 4 mg by mouth daily., Disp: , Rfl:    oxyCODONE-acetaminophen (PERCOCET/ROXICET) 5-325 MG tablet, Take 1-2 tablets by mouth every 6 (six) hours as needed for severe pain., Disp: 10 tablet, Rfl: 0   sildenafil (REVATIO) 20 MG tablet, Take 1-5 tablets (20-100 mg total) by mouth as needed. Take one po qd one hour prior to intercourse prn, Disp: 10 tablet, Rfl: 0   traZODone (DESYREL) 100 MG tablet, Take 1 tablet (100 mg total) by mouth at bedtime., Disp: 90 tablet, Rfl: 3 Past Medical History:  Diagnosis Date   Cancer ([place])    Chronic back pain    Chronic neck pain    Gout    Hypertension    Renal disorder    Past Surgical History:  Procedure Laterality Date   skin cancer removal      Objective:   Today's Vitals: BP (!) 162/102   Pulse 96   Temp 97.7 F (36.5 C) (Temporal)   Ht '6\' 2"'$  (1.88 m)   Wt 216 lb 6.4 oz (98.2 kg)   SpO2 99%   BMI 27.78 kg/m   Physical Exam Constitutional:      General: He is not in acute distress.    Appearance: Normal appearance. He is normal weight. He is not ill-appearing, toxic-appearing or diaphoretic.  Cardiovascular:  Rate and Rhythm: Normal rate and regular rhythm.  Pulmonary:     Effort: Pulmonary effort is normal.  Musculoskeletal:        General: Normal range of motion.     Right lower leg: No edema.     Left lower leg: No edema.  Neurological:     General: No focal deficit present.     Mental Status: He is alert and oriented to person, place, and time. Mental status is at baseline.  Psychiatric:        Mood and Affect: Mood normal.        Behavior: Behavior normal.        Thought Content: Thought content normal.        Judgment: Judgment normal.     Assessment & Plan:  History of melanoma -     Ambulatory referral to Dermatology  Skin lesion -     Ambulatory referral to Dermatology  Type 2 diabetes mellitus with stage 3a chronic kidney disease, without long-term current use of insulin (HCC) Assessment &  Plan: Continue glimiperide  Urine microalbumin and hga1c ordered today pending results.  Pt advised to:  Work on diabetic diet and exercise as tolerated. Yearly foot exam, and annual eye exam.    Orders: -     Hemoglobin A1c; Future -     Microalbumin / creatinine urine ratio; Future -     Basic metabolic panel; Future  Anemia, unspecified type -     CBC; Future  Atherosclerosis of aorta ([place])  Pure hypercholesterolemia -     Lipid panel; Future  BMI 27.0-27.9,adult  Hypertension associated with diabetes ([place]) Assessment & Plan: Refill vasotec  Did advise pt to monitor blood pressure over next two weeks and advise me of blood pressure log , goal discussed of <130/90   Orders: -     Enalapril Maleate; Take 1 tablet (20 mg total) by mouth daily.  Dispense: 90 tablet; Refill: 3 -     Microalbumin / creatinine urine ratio; Future  Sleep disorder Assessment & Plan: Refilled trazodone   Orders: -     traZODone HCl; Take 1 tablet (100 mg total) by mouth at bedtime.  Dispense: 90 tablet; Refill: 3  Erectile dysfunction, unspecified erectile dysfunction type -     Sildenafil Citrate; Take 1-5 tablets (20-100 mg total) by mouth as needed. Take one po qd one hour prior to intercourse prn  Dispense: 10 tablet; Refill: 0  Screening for colon cancer -     Cologuard    Follow-up: Return in about 3 weeks (around 12/08/2022) for f/u blood pressure pt to come with blood pressure log .   Eugenia Pancoast, FNP

## 2022-11-17 NOTE — Assessment & Plan Note (Signed)
Refilled trazodone.

## 2022-11-17 NOTE — Assessment & Plan Note (Signed)
Refill vasotec  Did advise pt to monitor blood pressure over next two weeks and advise me of blood pressure log , goal discussed of <130/90

## 2022-11-17 NOTE — Assessment & Plan Note (Signed)
Continue glimiperide  Urine microalbumin and hga1c ordered today pending results.  Pt advised to:  Work on diabetic diet and exercise as tolerated. Yearly foot exam, and annual eye exam.

## 2022-11-26 DIAGNOSIS — Z1211 Encounter for screening for malignant neoplasm of colon: Secondary | ICD-10-CM | POA: Diagnosis not present

## 2022-11-29 ENCOUNTER — Other Ambulatory Visit: Payer: Medicare Other

## 2022-12-01 LAB — COLOGUARD: COLOGUARD: NEGATIVE

## 2022-12-14 DIAGNOSIS — Z5181 Encounter for therapeutic drug level monitoring: Secondary | ICD-10-CM | POA: Diagnosis not present

## 2022-12-14 DIAGNOSIS — Z79899 Other long term (current) drug therapy: Secondary | ICD-10-CM | POA: Diagnosis not present

## 2022-12-14 DIAGNOSIS — Z79891 Long term (current) use of opiate analgesic: Secondary | ICD-10-CM | POA: Diagnosis not present

## 2022-12-14 DIAGNOSIS — M25561 Pain in right knee: Secondary | ICD-10-CM | POA: Diagnosis not present

## 2022-12-16 ENCOUNTER — Ambulatory Visit: Payer: Medicare Other | Admitting: Family

## 2022-12-30 ENCOUNTER — Ambulatory Visit: Payer: Medicare Other | Admitting: Family

## 2022-12-31 ENCOUNTER — Encounter: Payer: Self-pay | Admitting: Family

## 2023-04-12 DIAGNOSIS — G894 Chronic pain syndrome: Secondary | ICD-10-CM | POA: Diagnosis not present

## 2023-04-12 DIAGNOSIS — M5459 Other low back pain: Secondary | ICD-10-CM | POA: Diagnosis not present

## 2023-04-12 DIAGNOSIS — M5136 Other intervertebral disc degeneration, lumbar region: Secondary | ICD-10-CM | POA: Diagnosis not present

## 2023-04-12 DIAGNOSIS — M5030 Other cervical disc degeneration, unspecified cervical region: Secondary | ICD-10-CM | POA: Diagnosis not present

## 2023-04-12 DIAGNOSIS — Z79891 Long term (current) use of opiate analgesic: Secondary | ICD-10-CM | POA: Diagnosis not present

## 2023-05-03 DIAGNOSIS — D2271 Melanocytic nevi of right lower limb, including hip: Secondary | ICD-10-CM | POA: Diagnosis not present

## 2023-05-03 DIAGNOSIS — L821 Other seborrheic keratosis: Secondary | ICD-10-CM | POA: Diagnosis not present

## 2023-05-03 DIAGNOSIS — L570 Actinic keratosis: Secondary | ICD-10-CM | POA: Diagnosis not present

## 2023-05-03 DIAGNOSIS — L578 Other skin changes due to chronic exposure to nonionizing radiation: Secondary | ICD-10-CM | POA: Diagnosis not present

## 2023-05-03 DIAGNOSIS — D485 Neoplasm of uncertain behavior of skin: Secondary | ICD-10-CM | POA: Diagnosis not present

## 2023-05-03 DIAGNOSIS — C44519 Basal cell carcinoma of skin of other part of trunk: Secondary | ICD-10-CM | POA: Diagnosis not present

## 2023-05-03 DIAGNOSIS — D225 Melanocytic nevi of trunk: Secondary | ICD-10-CM | POA: Diagnosis not present

## 2023-05-03 DIAGNOSIS — X32XXXA Exposure to sunlight, initial encounter: Secondary | ICD-10-CM | POA: Diagnosis not present

## 2023-05-03 DIAGNOSIS — D044 Carcinoma in situ of skin of scalp and neck: Secondary | ICD-10-CM | POA: Diagnosis not present

## 2023-06-01 DIAGNOSIS — C44519 Basal cell carcinoma of skin of other part of trunk: Secondary | ICD-10-CM | POA: Diagnosis not present

## 2023-06-15 DIAGNOSIS — D044 Carcinoma in situ of skin of scalp and neck: Secondary | ICD-10-CM | POA: Diagnosis not present

## 2023-07-15 DIAGNOSIS — M25532 Pain in left wrist: Secondary | ICD-10-CM | POA: Diagnosis not present

## 2023-08-02 DIAGNOSIS — M25532 Pain in left wrist: Secondary | ICD-10-CM | POA: Diagnosis not present

## 2023-08-16 DIAGNOSIS — M5459 Other low back pain: Secondary | ICD-10-CM | POA: Diagnosis not present

## 2023-08-16 DIAGNOSIS — M542 Cervicalgia: Secondary | ICD-10-CM | POA: Diagnosis not present

## 2023-08-16 DIAGNOSIS — Z79891 Long term (current) use of opiate analgesic: Secondary | ICD-10-CM | POA: Diagnosis not present

## 2023-08-16 DIAGNOSIS — G8929 Other chronic pain: Secondary | ICD-10-CM | POA: Diagnosis not present

## 2023-08-16 DIAGNOSIS — M51362 Other intervertebral disc degeneration, lumbar region with discogenic back pain and lower extremity pain: Secondary | ICD-10-CM | POA: Diagnosis not present

## 2023-08-30 DIAGNOSIS — M25532 Pain in left wrist: Secondary | ICD-10-CM | POA: Diagnosis not present

## 2023-11-07 ENCOUNTER — Ambulatory Visit (INDEPENDENT_AMBULATORY_CARE_PROVIDER_SITE_OTHER): Admitting: Family Medicine

## 2023-11-07 ENCOUNTER — Encounter: Payer: Self-pay | Admitting: Family Medicine

## 2023-11-07 ENCOUNTER — Ambulatory Visit: Payer: Self-pay | Admitting: Family

## 2023-11-07 ENCOUNTER — Ambulatory Visit (INDEPENDENT_AMBULATORY_CARE_PROVIDER_SITE_OTHER)
Admission: RE | Admit: 2023-11-07 | Discharge: 2023-11-07 | Disposition: A | Discharge status: Home / Self Care | Source: Ambulatory Visit | Attending: Family Medicine | Admitting: Family Medicine

## 2023-11-07 VITALS — BP 142/82 | HR 107 | Temp 98.1°F | Ht 74.0 in | Wt 208.4 lb

## 2023-11-07 DIAGNOSIS — J069 Acute upper respiratory infection, unspecified: Secondary | ICD-10-CM

## 2023-11-07 DIAGNOSIS — R918 Other nonspecific abnormal finding of lung field: Secondary | ICD-10-CM | POA: Diagnosis not present

## 2023-11-07 MED ORDER — BENZONATATE 200 MG PO CAPS: 200.0000 mg | ORAL_CAPSULE | Freq: Three times a day (TID) | ORAL | 1 refills | Status: AC | PRN

## 2023-11-07 MED ORDER — PREDNISONE 10 MG PO TABS: ORAL_TABLET | ORAL | 0 refills | Status: AC

## 2023-11-07 NOTE — Patient Instructions (Addendum)
 Drink fluids and rest  mucinex DM is good for cough and congestion  Nasal saline for congestion as needed  Tylenol for fever or pain or headache  Try the tessalon pearles for cough  Please alert [place] if symptoms worsen (if severe or short of breath please go to the ER)   Let's check a chest xray now  We will reach out with result later today or tomorrow am  Then a plan- may consider some prednisone for bronchitis     Follow up with Tabitha for your blood pressure when you feel better

## 2023-11-07 NOTE — Assessment & Plan Note (Signed)
 Over a week / cough sounds wet but not productive Rhonchi and scant wheeze on exam  Cxr noted some interstitial changes centrally with peribronchial thickening  Suspect viral bronchitis   Disc symptomatic care - see instructions on AVS  Sent tessalon Prednisone 40 mg taper (discussed side effects incl elevated glucose)  Encouraged trial of expectorant and good fluid intake  Update if not starting to improve in a week or if worsening  Call back and Er precautions noted in detail today

## 2023-11-07 NOTE — Telephone Encounter (Signed)
 Patient has been scheduled

## 2023-11-07 NOTE — Progress Notes (Signed)
 Subjective:    Patient ID: Evan Jimenez, male    DOB: 01/30/1951, 73 y.o.   MRN: [id number]  HPI  Wt Readings from Last 3 Encounters:  11/07/23 208 lb 6 oz (94.5 kg)  11/17/22 216 lb 6.4 oz (98.2 kg)  05/25/19 209 lb (94.8 kg)   26.75 kg/m  Vitals:   11/07/23 1428  BP: (!) 142/82  Pulse: (!) 107  Temp: 98.1 F (36.7 C)  SpO2: 96%   73 yo pt of NP Dugal presents with uri symptoms Did flu and covid tests at home-negative   Symptoms over a week   Started with a cough  Low appetite but no n/v  Foggy feeling   Drank liquids Stayed in bed   Tried to work today  Nasty cough - cannot get phlegm up  Sounds junky  Little wheezing when lying down   Little st  No ear pain but they are making noise/ popping  Nasal congestion - not much coming out   No diarrhea    Cxr today DG Chest 2 View Result Date: 11/07/2023 CLINICAL DATA:  Junky cough for a week. EXAM: CHEST - 2 VIEW COMPARISON:  X-ray 06/23/2010. FINDINGS: No consolidation, pneumothorax or effusion. No edema. Normal cardiopericardial silhouette. Mild interstitial prominence centrally with some peribronchial thickening. IMPRESSION: Mild interstitial changes centrally with some peribronchial thickening. No consolidation. Electronically Signed   By: Karen Kays M.D.   On: 11/07/2023 17:50       Patient Active Problem List   Diagnosis Date Noted   Viral URI with cough 11/07/2023   Actinic keratosis 11/17/2022   Basal cell carcinoma (BCC) of right side of neck 11/17/2022   History of melanoma 11/17/2022   Skin lesion 11/17/2022   Type 2 diabetes mellitus with stage 3a chronic kidney disease, without long-term current use of insulin (HCC) 11/17/2022   Atherosclerosis of aorta (HCC) 11/17/2022   Anemia 11/17/2022   Pure hypercholesterolemia 11/17/2022   Hypertension associated with diabetes (HCC) 11/17/2022   BMI 27.0-27.9,adult 11/17/2022   Erectile dysfunction 11/17/2022   Sleep disorder 11/17/2022    Acquired trigger finger of left middle finger 10/13/2017   Chronic low back pain 09/28/2017   Chronic neck pain 09/28/2017   Chronic pain syndrome 09/28/2017   Degeneration of lumbar intervertebral disc 09/28/2017   Trigger finger 09/28/2017   Long-term current use of opiate analgesic 09/28/2017   Past Medical History:  Diagnosis Date   Cancer (HCC)    Chronic back pain    Chronic neck pain    Gout    Hypertension    Renal disorder    Past Surgical History:  Procedure Laterality Date   skin cancer removal     Social History   Tobacco Use   Smoking status: Never   Smokeless tobacco: Never  Vaping Use   Vaping status: Never Used  Substance Use Topics   Alcohol use: No   Drug use: No   Family History  Problem Relation Age of Onset   Hypertension Mother    Dementia Mother    Allergies  Allergen Reactions   Metformin And Related Other (See Comments)    diarrhea   Current Outpatient Medications on File Prior to Visit  Medication Sig Dispense Refill   atorvastatin (LIPITOR) 10 MG tablet Take 1 tablet by mouth daily.     cyclobenzaprine (FLEXERIL) 10 MG tablet Take 1 tablet by mouth 3 (three) times daily as needed.     enalapril (VASOTEC) 20 MG tablet  Take 1 tablet (20 mg total) by mouth daily. 90 tablet 3   glimepiride (AMARYL) 4 MG tablet Take 4 mg by mouth daily.     ibuprofen (ADVIL,MOTRIN) 600 MG tablet Take 1 tablet (600 mg total) by mouth every 8 (eight) hours as needed. 30 tablet 0   ondansetron (ZOFRAN) 4 MG tablet Take 4 mg by mouth daily.     oxyCODONE-acetaminophen (PERCOCET/ROXICET) 5-325 MG tablet Take 1-2 tablets by mouth every 6 (six) hours as needed for severe pain. 10 tablet 0   sildenafil (REVATIO) 20 MG tablet Take 1-5 tablets (20-100 mg total) by mouth as needed. Take one po qd one hour prior to intercourse prn 10 tablet 0   traZODone (DESYREL) 100 MG tablet Take 1 tablet (100 mg total) by mouth at bedtime. 90 tablet 3   No current  facility-administered medications on file prior to visit.    Review of Systems  Constitutional:  Positive for appetite change and fatigue. Negative for fever.  HENT:  Positive for congestion, postnasal drip, rhinorrhea, sinus pressure, sneezing and sore throat. Negative for ear pain.   Eyes:  Negative for pain and discharge.  Respiratory:  Positive for cough. Negative for shortness of breath, wheezing and stridor.   Cardiovascular:  Negative for chest pain.  Gastrointestinal:  Negative for diarrhea, nausea and vomiting.  Genitourinary:  Negative for frequency, hematuria and urgency.  Musculoskeletal:  Negative for arthralgias and myalgias.  Skin:  Negative for rash.  Neurological:  Negative for dizziness, weakness, light-headedness and headaches.  Psychiatric/Behavioral:  Negative for confusion and dysphoric mood.        Objective:   Physical Exam Constitutional:      General: He is not in acute distress.    Appearance: Normal appearance. He is well-developed and normal weight. He is not ill-appearing, toxic-appearing or diaphoretic.  HENT:     Head: Normocephalic and atraumatic.     Comments: Nares are injected and congested    No sinus tenderness     Right Ear: Tympanic membrane, ear canal and external ear normal.     Left Ear: Tympanic membrane, ear canal and external ear normal.     Nose: Congestion and rhinorrhea present.     Mouth/Throat:     Mouth: Mucous membranes are moist.     Pharynx: Oropharynx is clear. No oropharyngeal exudate or posterior oropharyngeal erythema.     Comments: Clear pnd  Eyes:     General:        Right eye: No discharge.        Left eye: No discharge.     Conjunctiva/sclera: Conjunctivae normal.     Pupils: Pupils are equal, round, and reactive to light.  Cardiovascular:     Rate and Rhythm: Tachycardia present.     Heart sounds: Normal heart sounds.  Pulmonary:     Effort: Pulmonary effort is normal. No respiratory distress.     Breath  sounds: No stridor. Wheezing and rhonchi present. No rales.     Comments: No shortness of breath with speech Some upper airways cleared by cough  Chest:     Chest wall: No tenderness.  Musculoskeletal:     Cervical back: Normal range of motion and neck supple.  Lymphadenopathy:     Cervical: No cervical adenopathy.  Skin:    General: Skin is warm and dry.     Capillary Refill: Capillary refill takes less than 2 seconds.     Findings: No rash.  Neurological:  Mental Status: He is alert.     Cranial Nerves: No cranial nerve deficit.  Psychiatric:        Mood and Affect: Mood normal.           Assessment & Plan:   Problem List Items Addressed This Visit       Respiratory   Viral URI with cough - Primary   Over a week / cough sounds wet but not productive Rhonchi and scant wheeze on exam  Cxr noted some interstitial changes centrally with peribronchial thickening  Suspect viral bronchitis   Disc symptomatic care - see instructions on AVS  Sent tessalon Prednisone 40 mg taper (discussed side effects incl elevated glucose)  Encouraged trial of expectorant and good fluid intake  Update if not starting to improve in a week or if worsening  Call back and Er precautions noted in detail today        Relevant Orders   DG Chest 2 View (Completed)

## 2023-11-07 NOTE — Telephone Encounter (Signed)
 1st attempt-voicemail left to call office back Summary: Cough/Foggy Head   Copied From CRM [phone]. Reason for Triage: Patient says he has a cold, tested negative for covid/flu - he has a cough and foggy head and would like a medication for symptoms

## 2023-11-08 ENCOUNTER — Ambulatory Visit: Payer: Medicare Other | Admitting: Dermatology

## 2023-12-28 DIAGNOSIS — M5030 Other cervical disc degeneration, unspecified cervical region: Secondary | ICD-10-CM | POA: Diagnosis not present

## 2023-12-28 DIAGNOSIS — M542 Cervicalgia: Secondary | ICD-10-CM | POA: Diagnosis not present

## 2023-12-28 DIAGNOSIS — G894 Chronic pain syndrome: Secondary | ICD-10-CM | POA: Diagnosis not present

## 2023-12-28 DIAGNOSIS — Z79891 Long term (current) use of opiate analgesic: Secondary | ICD-10-CM | POA: Diagnosis not present

## 2023-12-28 DIAGNOSIS — Z79899 Other long term (current) drug therapy: Secondary | ICD-10-CM | POA: Diagnosis not present

## 2023-12-28 DIAGNOSIS — M51362 Other intervertebral disc degeneration, lumbar region with discogenic back pain and lower extremity pain: Secondary | ICD-10-CM | POA: Diagnosis not present

## 2023-12-28 DIAGNOSIS — G8929 Other chronic pain: Secondary | ICD-10-CM | POA: Diagnosis not present

## 2023-12-28 DIAGNOSIS — M5459 Other low back pain: Secondary | ICD-10-CM | POA: Diagnosis not present

## 2023-12-28 DIAGNOSIS — Z5181 Encounter for therapeutic drug level monitoring: Secondary | ICD-10-CM | POA: Diagnosis not present

## 2024-04-24 DIAGNOSIS — M51362 Other intervertebral disc degeneration, lumbar region with discogenic back pain and lower extremity pain: Secondary | ICD-10-CM | POA: Diagnosis not present

## 2024-04-24 DIAGNOSIS — G8929 Other chronic pain: Secondary | ICD-10-CM | POA: Diagnosis not present

## 2024-04-24 DIAGNOSIS — K5900 Constipation, unspecified: Secondary | ICD-10-CM | POA: Diagnosis not present

## 2024-04-24 DIAGNOSIS — M5459 Other low back pain: Secondary | ICD-10-CM | POA: Diagnosis not present

## 2024-04-24 DIAGNOSIS — K437 Other and unspecified ventral hernia with gangrene: Secondary | ICD-10-CM | POA: Diagnosis not present

## 2024-04-24 DIAGNOSIS — M542 Cervicalgia: Secondary | ICD-10-CM | POA: Diagnosis not present
